# Patient Record
Sex: Female | Born: 1990
Health system: Southern US, Community
[De-identification: ages and names within clinical notes are randomized; demographics above are authoritative.]

## PROBLEM LIST (undated history)

## (undated) DIAGNOSIS — I10 Essential (primary) hypertension: Secondary | ICD-10-CM

## (undated) DIAGNOSIS — Z789 Other specified health status: Secondary | ICD-10-CM

## (undated) DIAGNOSIS — O139 Gestational [pregnancy-induced] hypertension without significant proteinuria, unspecified trimester: Secondary | ICD-10-CM

## (undated) HISTORY — PX: DILATION AND CURETTAGE OF UTERUS: SHX78

## (undated) HISTORY — DX: Other specified health status: Z78.9

---

## 2018-11-21 ENCOUNTER — Encounter: Payer: Self-pay | Admitting: Certified Nurse Midwife

## 2018-11-22 ENCOUNTER — Other Ambulatory Visit (HOSPITAL_COMMUNITY)
Admission: RE | Admit: 2018-11-22 | Discharge: 2018-11-22 | Disposition: A | Discharge status: Home / Self Care | Payer: Medicaid Other | Source: Ambulatory Visit | Attending: Certified Nurse Midwife | Admitting: Certified Nurse Midwife

## 2018-11-22 ENCOUNTER — Ambulatory Visit (INDEPENDENT_AMBULATORY_CARE_PROVIDER_SITE_OTHER): Payer: Medicaid Other | Admitting: Certified Nurse Midwife

## 2018-11-22 ENCOUNTER — Encounter: Payer: Self-pay | Admitting: Certified Nurse Midwife

## 2018-11-22 VITALS — BP 115/77 | HR 77 | Ht 61.0 in | Wt 135.0 lb

## 2018-11-22 DIAGNOSIS — Z283 Underimmunization status: Secondary | ICD-10-CM

## 2018-11-22 DIAGNOSIS — Z3480 Encounter for supervision of other normal pregnancy, unspecified trimester: Secondary | ICD-10-CM | POA: Diagnosis not present

## 2018-11-22 DIAGNOSIS — Z3481 Encounter for supervision of other normal pregnancy, first trimester: Secondary | ICD-10-CM

## 2018-11-22 DIAGNOSIS — O99891 Other specified diseases and conditions complicating pregnancy: Secondary | ICD-10-CM

## 2018-11-22 DIAGNOSIS — Z2839 Other underimmunization status: Secondary | ICD-10-CM

## 2018-11-22 DIAGNOSIS — O219 Vomiting of pregnancy, unspecified: Secondary | ICD-10-CM

## 2018-11-22 DIAGNOSIS — O9989 Other specified diseases and conditions complicating pregnancy, childbirth and the puerperium: Secondary | ICD-10-CM

## 2018-11-22 MED ORDER — PREPLUS 27-1 MG PO TABS: 1.0000 | ORAL_TABLET | Freq: Every day | ORAL | 13 refills | Status: AC

## 2018-11-22 MED ORDER — ONDANSETRON 4 MG PO TBDP: 4.0000 mg | ORAL_TABLET | Freq: Three times a day (TID) | ORAL | 2 refills | Status: DC | PRN

## 2018-11-22 NOTE — Patient Instructions (Signed)
Safe Medications in Pregnancy   Acne:  Benzoyl Peroxide  Salicylic Acid   Backache/Headache:  Tylenol: 2 regular strength every 4 hours OR        2 Extra strength every 6 hours   Colds/Coughs/Allergies:  Benadryl (alcohol free) 25 mg every 6 hours as needed  Breath right strips  Claritin  Cepacol throat lozenges  Chloraseptic throat spray  Cold-Eeze- up to three times per day  Cough drops, alcohol free  Flonase (by prescription only)  Guaifenesin  Mucinex  Robitussin DM (plain only, alcohol free)  Saline nasal spray/drops  Sudafed (pseudoephedrine) & Actifed * use only after [redacted] weeks gestation and if you do not have high blood pressure  Tylenol  Vicks Vaporub  Zinc lozenges  Zyrtec   Constipation:  Colace  Ducolax suppositories  Fleet enema  Glycerin suppositories  Metamucil  Milk of magnesia  Miralax  Senokot  Smooth move tea   Diarrhea:  Kaopectate  Imodium A-D   *NO pepto Bismol   Hemorrhoids:  Anusol  Anusol HC  Preparation H  Tucks   Indigestion:  Tums  Maalox  Mylanta  Zantac  Pepcid   Insomnia:  Benadryl (alcohol free) 25mg every 6 hours as needed  Tylenol PM  Unisom, no Gelcaps   Leg Cramps:  Tums  MagGel   Nausea/Vomiting:  Bonine  Dramamine  Emetrol  Ginger extract  Sea bands  Meclizine  Nausea medication to take during pregnancy:  Unisom (doxylamine succinate 25 mg tablets) Take one tablet daily at bedtime. If symptoms are not adequately controlled, the dose can be increased to a maximum recommended dose of two tablets daily (1/2 tablet in the morning, 1/2 tablet mid-afternoon and one at bedtime).  Vitamin B6 100mg tablets. Take one tablet twice a day (up to 200 mg per day).   Skin Rashes:  Aveeno products  Benadryl cream or 25mg every 6 hours as needed  Calamine Lotion  1% cortisone cream   Yeast infection:  Gyne-lotrimin 7  Monistat 7    **If taking multiple medications, please check labels to avoid  duplicating the same active ingredients  **take medication as directed on the label  ** Do not exceed 4000 mg of tylenol in 24 hours  **Do not take medications that contain aspirin or ibuprofen         First Trimester of Pregnancy The first trimester of pregnancy is from week 1 until the end of week 13 (months 1 through 3). During this time, your baby will begin to develop inside you. At 6-8 weeks, the eyes and face are formed, and the heartbeat can be seen on ultrasound. At the end of 12 weeks, all the baby's organs are formed. Prenatal care is all the medical care you receive before the birth of your baby. Make sure you get good prenatal care and follow all of your doctor's instructions. Follow these instructions at home: Medicines  Take over-the-counter and prescription medicines only as told by your doctor. Some medicines are safe and some medicines are not safe during pregnancy.  Take a prenatal vitamin that contains at least 600 micrograms (mcg) of folic acid.  If you have trouble pooping (constipation), take medicine that will make your stool soft (stool softener) if your doctor approves. Eating and drinking  Eat regular, healthy meals.  Your doctor will tell you the amount of weight gain that is right for you.  Avoid raw meat and uncooked cheese.  If you feel sick to your stomach (nauseous) or   throw up (vomit): ? Eat 4 or 5 small meals a day instead of 3 large meals. ? Try eating a few soda crackers. ? Drink liquids between meals instead of during meals.  To prevent constipation: ? Eat foods that are high in fiber, like fresh fruits and vegetables, whole grains, and beans. ? Drink enough fluids to keep your pee (urine) clear or pale yellow. Activity  Exercise only as told by your doctor. Stop exercising if you have cramps or pain in your lower belly (abdomen) or low back.  Do not exercise if it is too hot, too humid, or if you are in a place of great height (high  altitude).  Try to avoid standing for long periods of time. Move your legs often if you must stand in one place for a long time.  Avoid heavy lifting.  Wear low-heeled shoes. Sit and stand up straight.  You can have sex unless your doctor tells you not to. Relieving pain and discomfort  Wear a good support bra if your breasts are sore.  Take warm water baths (sitz baths) to soothe pain or discomfort caused by hemorrhoids. Use hemorrhoid cream if your doctor says it is okay.  Rest with your legs raised if you have leg cramps or low back pain.  If you have puffy, bulging veins (varicose veins) in your legs: ? Wear support hose or compression stockings as told by your doctor. ? Raise (elevate) your feet for 15 minutes, 3-4 times a day. ? Limit salt in your food. Prenatal care  Schedule your prenatal visits by the twelfth week of pregnancy.  Write down your questions. Take them to your prenatal visits.  Keep all your prenatal visits as told by your doctor. This is important. Safety  Wear your seat belt at all times when driving.  Make a list of emergency phone numbers. The list should include numbers for family, friends, the hospital, and police and fire departments. General instructions  Ask your doctor for a referral to a local prenatal class. Begin classes no later than at the start of month 6 of your pregnancy.  Ask for help if you need counseling or if you need help with nutrition. Your doctor can give you advice or tell you where to go for help.  Do not use hot tubs, steam rooms, or saunas.  Do not douche or use tampons or scented sanitary pads.  Do not cross your legs for long periods of time.  Avoid all herbs and alcohol. Avoid drugs that are not approved by your doctor.  Do not use any tobacco products, including cigarettes, chewing tobacco, and electronic cigarettes. If you need help quitting, ask your doctor. You may get counseling or other support to help you  quit.  Avoid cat litter boxes and soil used by cats. These carry germs that can cause birth defects in the baby and can cause a loss of your baby (miscarriage) or stillbirth.  Visit your dentist. At home, brush your teeth with a soft toothbrush. Be gentle when you floss. Contact a doctor if:  You are dizzy.  You have mild cramps or pressure in your lower belly.  You have a nagging pain in your belly area.  You continue to feel sick to your stomach, you throw up, or you have watery poop (diarrhea).  You have a bad smelling fluid coming from your vagina.  You have pain when you pee (urinate).  You have increased puffiness (swelling) in your face, hands, legs,   or ankles. Get help right away if:  You have a fever.  You are leaking fluid from your vagina.  You have spotting or bleeding from your vagina.  You have very bad belly cramping or pain.  You gain or lose weight rapidly.  You throw up blood. It may look like coffee grounds.  You are around people who have German measles, fifth disease, or chickenpox.  You have a very bad headache.  You have shortness of breath.  You have any kind of trauma, such as from a fall or a car accident. Summary  The first trimester of pregnancy is from week 1 until the end of week 13 (months 1 through 3).  To take care of yourself and your unborn baby, you will need to eat healthy meals, take medicines only if your doctor tells you to do so, and do activities that are safe for you and your baby.  Keep all follow-up visits as told by your doctor. This is important as your doctor will have to ensure that your baby is healthy and growing well. This information is not intended to replace advice given to you by your health care provider. Make sure you discuss any questions you have with your health care provider. Document Released: 05/23/2008 Document Revised: 12/13/2016 Document Reviewed: 12/13/2016 Elsevier Interactive Patient Education   2017 Elsevier Inc.  

## 2018-11-23 DIAGNOSIS — O99891 Other specified diseases and conditions complicating pregnancy: Secondary | ICD-10-CM | POA: Insufficient documentation

## 2018-11-23 DIAGNOSIS — O9989 Other specified diseases and conditions complicating pregnancy, childbirth and the puerperium: Secondary | ICD-10-CM

## 2018-11-23 DIAGNOSIS — Z283 Underimmunization status: Secondary | ICD-10-CM | POA: Insufficient documentation

## 2018-11-23 DIAGNOSIS — O09899 Supervision of other high risk pregnancies, unspecified trimester: Secondary | ICD-10-CM | POA: Insufficient documentation

## 2018-11-23 DIAGNOSIS — O219 Vomiting of pregnancy, unspecified: Secondary | ICD-10-CM | POA: Insufficient documentation

## 2018-11-23 LAB — OBSTETRIC PANEL, INCLUDING HIV
Antibody Screen: NEGATIVE
Basophils Absolute: 0 10*3/uL (ref 0.0–0.2)
Basos: 0 %
EOS (ABSOLUTE): 0.1 10*3/uL (ref 0.0–0.4)
Eos: 1 %
HIV Screen 4th Generation wRfx: NONREACTIVE
Hematocrit: 31.2 % — ABNORMAL LOW (ref 34.0–46.6)
Hemoglobin: 10 g/dL — ABNORMAL LOW (ref 11.1–15.9)
Hepatitis B Surface Ag: NEGATIVE
Immature Grans (Abs): 0 10*3/uL (ref 0.0–0.1)
Immature Granulocytes: 0 %
Lymphocytes Absolute: 1.9 10*3/uL (ref 0.7–3.1)
Lymphs: 18 %
MCH: 23.9 pg — ABNORMAL LOW (ref 26.6–33.0)
MCHC: 32.1 g/dL (ref 31.5–35.7)
MCV: 75 fL — ABNORMAL LOW (ref 79–97)
Monocytes Absolute: 0.9 10*3/uL (ref 0.1–0.9)
Monocytes: 8 %
Neutrophils Absolute: 7.5 10*3/uL — ABNORMAL HIGH (ref 1.4–7.0)
Neutrophils: 73 %
Platelets: 390 10*3/uL (ref 150–450)
RBC: 4.18 x10E6/uL (ref 3.77–5.28)
RDW: 14 % (ref 12.3–15.4)
RPR Ser Ql: NONREACTIVE
Rh Factor: POSITIVE
Rubella Antibodies, IGG: <0.9 index — ABNORMAL LOW (ref >0.99)
WBC: 10.4 10*3/uL (ref 3.4–10.8)

## 2018-11-23 LAB — CYTOLOGY - PAP
Chlamydia: NEGATIVE
Diagnosis: NEGATIVE
Neisseria Gonorrhea: NEGATIVE
Trichomonas: NEGATIVE

## 2018-11-23 NOTE — Progress Notes (Signed)
Subjective:   Taylor Cole is a 27 y.o. G2P1001 at [redacted]w[redacted]d by LMP being seen today for her first obstetrical visit.  Her obstetrical history is significant for nothing. Patient does intend to breast feed. Pregnancy history fully reviewed.  Patient reports nausea and vomiting.  HISTORY: OB History  Gravida Para Term Preterm AB Living  2 1 1  0 0 1  SAB TAB Ectopic Multiple Live Births  0 0 0 0 1    # Outcome Date GA Lbr Len/2nd Weight Sex Delivery Anes PTL Lv  2 Current           1 Term 09/16/16    F Vag-Spont   LIV    She reports that she has never had a pap smear   History reviewed. No pertinent past medical history. History reviewed. No pertinent surgical history. History reviewed. No pertinent family history. Social History   Tobacco Use  . Smoking status: Never Smoker  . Smokeless tobacco: Never Used  Substance Use Topics  . Alcohol use: Never    Frequency: Never  . Drug use: Never   Not on File Current Outpatient Medications on File Prior to Visit  Medication Sig Dispense Refill  . folic acid (FOLVITE) 1 MG tablet Take 1 mg by mouth daily.     No current facility-administered medications on file prior to visit.     Review of Systems Pertinent items noted in HPI and remainder of comprehensive ROS otherwise negative.  Exam   Vitals:   11/22/18 1606 11/22/18 1607  BP: 115/77   Pulse: 77   Weight: 135 lb (61.2 kg)   Height:  5\' 1"  (1.549 m)   Fetal Heart Rate (bpm): 160  Pelvic Exam: Perineum: no hemorrhoids, normal perineum   Vulva: normal external genitalia, no lesions   Vagina:  normal mucosa, normal discharge   Cervix: no lesions and normal, pap smear done.    Adnexa: normal adnexa and no mass, fullness, tenderness   Bony Pelvis: average  System: General: well-developed, well-nourished female in no acute distress   Breasts:  normal appearance, no masses or tenderness bilaterally   Skin: normal coloration and turgor, no rashes   Neurologic:  oriented, normal, negative, normal mood   Extremities: normal strength, tone, and muscle mass, ROM of all joints is normal   HEENT PERRLA, extraocular movement intact and sclera clear   Mouth/Teeth mucous membranes moist, pharynx normal without lesions and dental hygiene good   Neck supple and no masses   Cardiovascular: regular rate and rhythm   Respiratory:  no respiratory distress, normal breath sounds   Abdomen: soft, non-tender; bowel sounds normal; no masses,  no organomegaly   Assessment:   Pregnancy: G2P1001 Patient Active Problem List   Diagnosis Date Noted  . Rubella non-immune status, antepartum 11/23/2018  . Nausea and vomiting during pregnancy 11/23/2018  . Supervision of other normal pregnancy, antepartum 11/22/2018     Plan:  1. Supervision of other normal pregnancy, antepartum - Patient doing well, reports nausea and vomiting during pregnancy  - Obstetric Panel, Including HIV - Hemoglobinopathy evaluation - Culture, OB Urine - Cystic Fibrosis Mutation 97 - SMN1 Copy Number Analysis - Genetic Screening - Cytology - PAP( [place]) - Prenatal Vit-Fe Fumarate-FA (PREPLUS) 27-1 MG TABS; Take 1 tablet by mouth daily.  Dispense: 30 tablet; Refill: 13  2. Nausea and vomiting during pregnancy - educated and discussed eating habits during pregnancy  - ondansetron (ZOFRAN ODT) 4 MG disintegrating tablet; Take 1  tablet (4 mg total) by mouth every 8 (eight) hours as needed for nausea or vomiting.  Dispense: 30 tablet; Refill: 2   Initial labs drawn. Rx for prenatal vitamins. Genetic Screening discussed, NIPS: ordered. Ultrasound discussed; fetal anatomic survey: requested. Problem list reviewed and updated. The nature of [facility] with multiple MDs and other Advanced Practice Providers was explained to patient; also emphasized that residents, students are part of our team. Routine obstetric precautions reviewed. Return in about 4  weeks (around 12/20/2018) for ROB.     Sharyon CableVeronica C Lodema Parma, CNM Center for [facility], [facility]

## 2018-11-25 LAB — URINE CULTURE, OB REFLEX

## 2018-11-25 LAB — CULTURE, OB URINE

## 2018-11-27 LAB — HEMOGLOBINOPATHY EVALUATION
HGB C: 0 %
HGB S: 0 %
HGB VARIANT: 0 %
Hemoglobin A2 Quantitation: 2 % (ref 1.8–3.2)
Hemoglobin F Quantitation: 0 % (ref 0.0–2.0)
Hgb A: 98 % (ref 96.4–98.8)

## 2018-12-04 LAB — CYSTIC FIBROSIS MUTATION 97: Interpretation: NOT DETECTED

## 2018-12-04 LAB — SMN1 COPY NUMBER ANALYSIS (SMA CARRIER SCREENING)

## 2018-12-17 ENCOUNTER — Telehealth: Payer: Self-pay

## 2018-12-17 NOTE — Telephone Encounter (Signed)
Returned call, no answer, left vm 

## 2018-12-20 ENCOUNTER — Ambulatory Visit (INDEPENDENT_AMBULATORY_CARE_PROVIDER_SITE_OTHER): Payer: Medicaid Other | Admitting: Medical

## 2018-12-20 ENCOUNTER — Encounter: Payer: Self-pay | Admitting: Certified Nurse Midwife

## 2018-12-20 ENCOUNTER — Encounter: Payer: Self-pay | Admitting: Medical

## 2018-12-20 DIAGNOSIS — Z3480 Encounter for supervision of other normal pregnancy, unspecified trimester: Secondary | ICD-10-CM

## 2018-12-20 DIAGNOSIS — O285 Abnormal chromosomal and genetic finding on antenatal screening of mother: Secondary | ICD-10-CM

## 2018-12-20 DIAGNOSIS — Z148 Genetic carrier of other disease: Secondary | ICD-10-CM | POA: Insufficient documentation

## 2018-12-20 DIAGNOSIS — Z3482 Encounter for supervision of other normal pregnancy, second trimester: Secondary | ICD-10-CM

## 2018-12-20 NOTE — Progress Notes (Signed)
Pt presents for ROB requests to discuss genetic test results.

## 2018-12-20 NOTE — Patient Instructions (Signed)

## 2018-12-20 NOTE — Progress Notes (Signed)
   PRENATAL VISIT NOTE  Subjective:  Taylor Cole is a 28 y.o. G2P1001 at [redacted]w[redacted]d being seen today for ongoing prenatal care.  She is currently monitored for the following issues for this high-risk pregnancy and has Supervision of other normal pregnancy, antepartum; Rubella non-immune status, antepartum; Nausea and vomiting during pregnancy; and Abnormal genetic test during pregnancy on their problem list.  Patient reports no complaints.  Contractions: Not present. Vag. Bleeding: None.  Movement: Absent. Denies leaking of fluid.   The following portions of the patient's history were reviewed and updated as appropriate: allergies, current medications, past family history, past medical history, past social history, past surgical history and problem list. Problem list updated.  Objective:   Vitals:   12/20/18 1500  BP: 129/81  Pulse: 87  Weight: 138 lb 12.8 oz (63 kg)    Fetal Status: Fetal Heart Rate (bpm): 168   Movement: Absent     General:  Alert, oriented and cooperative. Patient is in no acute distress.  Skin: Skin is warm and dry. No rash noted.   Cardiovascular: Normal heart rate noted  Respiratory: Normal respiratory effort, no problems with respiration noted  Abdomen: Soft, gravid, appropriate for gestational age.  Pain/Pressure: Absent     Pelvic: Cervical exam deferred        Extremities: Normal range of motion.  Edema: None  Mental Status: Normal mood and affect. Normal behavior. Normal judgment and thought content.   Assessment and Plan:  Pregnancy: G2P1001 at [redacted]w[redacted]d  1. Supervision of other normal pregnancy, antepartum - AFP, Serum, Open Spina Bifida - [place] MFM OB COMP + 14 WK; Future  2. Abnormal genetic test during pregnancy - AMB referral to maternal fetal medicine  Preterm labor/second trimester warning symptoms and general obstetric precautions including but not limited to vaginal bleeding, contractions, leaking of fluid and fetal movement were reviewed in detail  with the patient. Please refer to After Visit Summary for other counseling recommendations.  Return in about 4 weeks (around 01/17/2019) for [facility].  Future Appointments  Date Time Provider Department Center  01/17/2019  3:00 PM Hermina Staggers, MD [facility] None    Vonzella Nipple, PA-C

## 2018-12-22 LAB — AFP, SERUM, OPEN SPINA BIFIDA
AFP MoM: 0.67
AFP Value: 23 ng/mL
Gest. Age on Collection Date: 15.1 weeks
MATERNAL AGE AT EDD: 29.1 a
OSBR Risk 1 IN: 10000
Test Results:: NEGATIVE
Weight: 138 [lb_av]

## 2018-12-25 ENCOUNTER — Other Ambulatory Visit: Payer: Self-pay | Admitting: *Deleted

## 2018-12-25 DIAGNOSIS — Z3480 Encounter for supervision of other normal pregnancy, unspecified trimester: Secondary | ICD-10-CM

## 2018-12-25 NOTE — Progress Notes (Signed)
Change needed in u/s order per A.Garner Nash.

## 2019-01-04 ENCOUNTER — Other Ambulatory Visit (HOSPITAL_COMMUNITY): Payer: Self-pay

## 2019-01-04 ENCOUNTER — Ambulatory Visit (HOSPITAL_COMMUNITY): Payer: Self-pay | Admitting: Medical

## 2019-01-04 ENCOUNTER — Ambulatory Visit (HOSPITAL_BASED_OUTPATIENT_CLINIC_OR_DEPARTMENT_OTHER)
Admission: RE | Admit: 2019-01-04 | Discharge: 2019-01-04 | Disposition: A | Discharge status: Home / Self Care | Payer: Medicaid Other | Source: Ambulatory Visit | Attending: Medical | Admitting: Medical

## 2019-01-04 ENCOUNTER — Encounter (HOSPITAL_COMMUNITY): Payer: Self-pay

## 2019-01-04 ENCOUNTER — Ambulatory Visit (HOSPITAL_COMMUNITY)
Admission: RE | Admit: 2019-01-04 | Discharge: 2019-01-04 | Disposition: A | Discharge status: Home / Self Care | Payer: Medicaid Other | Source: Ambulatory Visit | Attending: Medical | Admitting: Medical

## 2019-01-04 ENCOUNTER — Other Ambulatory Visit (HOSPITAL_COMMUNITY): Payer: Self-pay | Admitting: Medical

## 2019-01-04 VITALS — BP 133/77 | HR 89 | Wt 141.0 lb

## 2019-01-04 DIAGNOSIS — Z148 Genetic carrier of other disease: Secondary | ICD-10-CM | POA: Insufficient documentation

## 2019-01-04 DIAGNOSIS — Z363 Encounter for antenatal screening for malformations: Secondary | ICD-10-CM | POA: Diagnosis not present

## 2019-01-04 DIAGNOSIS — O289 Unspecified abnormal findings on antenatal screening of mother: Secondary | ICD-10-CM | POA: Insufficient documentation

## 2019-01-04 DIAGNOSIS — Z3A17 17 weeks gestation of pregnancy: Secondary | ICD-10-CM | POA: Diagnosis not present

## 2019-01-04 DIAGNOSIS — O9989 Other specified diseases and conditions complicating pregnancy, childbirth and the puerperium: Secondary | ICD-10-CM

## 2019-01-04 DIAGNOSIS — Z3480 Encounter for supervision of other normal pregnancy, unspecified trimester: Secondary | ICD-10-CM

## 2019-01-04 DIAGNOSIS — Z283 Underimmunization status: Secondary | ICD-10-CM

## 2019-01-04 DIAGNOSIS — O285 Abnormal chromosomal and genetic finding on antenatal screening of mother: Secondary | ICD-10-CM

## 2019-01-04 DIAGNOSIS — O219 Vomiting of pregnancy, unspecified: Secondary | ICD-10-CM

## 2019-01-04 DIAGNOSIS — O09899 Supervision of other high risk pregnancies, unspecified trimester: Secondary | ICD-10-CM

## 2019-01-07 ENCOUNTER — Other Ambulatory Visit (HOSPITAL_COMMUNITY): Payer: Self-pay | Admitting: Medical

## 2019-01-07 ENCOUNTER — Telehealth (HOSPITAL_COMMUNITY): Payer: Self-pay | Admitting: Obstetrics and Gynecology

## 2019-01-07 NOTE — Telephone Encounter (Signed)
Called patient and informed her of abnormal FISH results. Female TRISOMY 21. Final culture results are pending.

## 2019-01-08 ENCOUNTER — Institutional Professional Consult (permissible substitution): Payer: Medicaid Other | Admitting: Obstetrics & Gynecology

## 2019-01-08 ENCOUNTER — Telehealth: Payer: Self-pay

## 2019-01-08 NOTE — Telephone Encounter (Signed)
Returned call, pt wanted to see if she can get earlier appt to discuss results, transferred to scheduler.

## 2019-01-09 ENCOUNTER — Ambulatory Visit (INDEPENDENT_AMBULATORY_CARE_PROVIDER_SITE_OTHER): Payer: Medicaid Other | Admitting: Obstetrics & Gynecology

## 2019-01-09 VITALS — BP 118/78 | HR 86 | Wt 142.5 lb

## 2019-01-09 DIAGNOSIS — O285 Abnormal chromosomal and genetic finding on antenatal screening of mother: Secondary | ICD-10-CM | POA: Diagnosis not present

## 2019-01-09 DIAGNOSIS — Z3482 Encounter for supervision of other normal pregnancy, second trimester: Secondary | ICD-10-CM

## 2019-01-09 NOTE — Progress Notes (Signed)
OB presents for Consult regarding [place]

## 2019-01-09 NOTE — Progress Notes (Signed)
   PRENATAL VISIT NOTE  Subjective:  Taylor Cole is a 28 y.o. G2P1001 at [redacted]w[redacted]d being seen today for ongoing prenatal care.  She is currently monitored for the following issues for this high-risk pregnancy and has Supervision of other normal pregnancy, antepartum; Rubella non-immune status, antepartum; Nausea and vomiting during pregnancy; and Abnormal genetic test during pregnancy on their problem list.  Patient reports recent amnio Fish result Tri 21, requests termination.  Contractions: Not present. Vag. Bleeding: None.   . Denies leaking of fluid.   The following portions of the patient's history were reviewed and updated as appropriate: allergies, current medications, past family history, past medical history, past social history, past surgical history and problem list. Problem list updated.  Objective:   Vitals:   01/09/19 1441  BP: 118/78  Pulse: 86  Weight: 142 lb 8 oz (64.6 kg)    Fetal Status:           General:  Alert, oriented and cooperative. Patient is in no acute distress.  Skin: Skin is warm and dry. No rash noted.   Cardiovascular: Normal heart rate noted  Respiratory: Normal respiratory effort, no problems with respiration noted  Abdomen: Soft, gravid, appropriate for gestational age.  Pain/Pressure: Absent     Pelvic: Cervical exam deferred        Extremities: Normal range of motion.  Edema: None  Mental Status: Normal mood and affect. Normal behavior. Normal judgment and thought content.   Assessment and Plan:  Pregnancy: G2P1001 at [redacted]w[redacted]d  There are no diagnoses linked to this encounter. Preterm labor symptoms and general obstetric precautions including but not limited to vaginal bleeding, contractions, leaking of fluid and fetal movement were reviewed in detail with the patient. Please refer to After Visit Summary for other counseling recommendations.  Return if symptoms worsen or fail to improve.  Future Appointments  Date Time Provider Department Center    01/21/2019  3:00 PM Constant, Gigi Gin, MD [facility] None  01/24/2019 11:15 AM [facility] [place] 4 [facility]   Referred to PP for consultation re: termination prior to 20 weeks Scheryl Darter, MD

## 2019-01-14 ENCOUNTER — Other Ambulatory Visit (HOSPITAL_COMMUNITY): Payer: Self-pay | Admitting: Medical

## 2019-01-17 ENCOUNTER — Telehealth: Payer: Self-pay

## 2019-01-17 ENCOUNTER — Encounter: Payer: Medicaid Other | Admitting: Obstetrics and Gynecology

## 2019-01-17 NOTE — Telephone Encounter (Signed)
Returned call and pt stated that she is nol longer pregnant and would like to cancel all appointments Informed scheduler.

## 2019-01-18 ENCOUNTER — Ambulatory Visit (HOSPITAL_COMMUNITY): Payer: Medicaid Other

## 2019-01-21 ENCOUNTER — Encounter: Payer: Medicaid Other | Admitting: Obstetrics and Gynecology

## 2019-01-24 ENCOUNTER — Ambulatory Visit (HOSPITAL_COMMUNITY): Payer: Medicaid Other

## 2019-01-25 ENCOUNTER — Ambulatory Visit (HOSPITAL_COMMUNITY): Payer: Medicaid Other

## 2019-01-25 ENCOUNTER — Other Ambulatory Visit (HOSPITAL_COMMUNITY): Payer: Self-pay | Admitting: Medical

## 2019-01-31 ENCOUNTER — Telehealth (HOSPITAL_COMMUNITY): Payer: Self-pay | Admitting: Obstetrics and Gynecology

## 2019-01-31 NOTE — Telephone Encounter (Signed)
I called the patient to confirm that she [facility] about final [facility] (culture) results. Final results confirmed trisomy 21. Patient had TOP and feels well.

## 2019-05-07 ENCOUNTER — Ambulatory Visit (INDEPENDENT_AMBULATORY_CARE_PROVIDER_SITE_OTHER): Payer: Medicaid Other

## 2019-05-07 ENCOUNTER — Other Ambulatory Visit: Payer: Self-pay

## 2019-05-07 DIAGNOSIS — Z3201 Encounter for pregnancy test, result positive: Secondary | ICD-10-CM

## 2019-05-07 DIAGNOSIS — Z3200 Encounter for pregnancy test, result unknown: Secondary | ICD-10-CM

## 2019-05-07 LAB — POCT URINE PREGNANCY: Preg Test, Ur: POSITIVE — AB

## 2019-05-07 NOTE — Progress Notes (Signed)
Agree with A & P. 

## 2019-05-07 NOTE — Progress Notes (Signed)
..   Ms. Paulino presents today for UPT. She has no unusual complaints. LMP: 04-07-19    OBJECTIVE: Appears well, in no apparent distress.  OB History    Gravida  3   Para  1   Term  1   Preterm      AB  1   Living  1     SAB      TAB  1   Ectopic      Multiple      Live Births  1          Home UPT Result:Positive  In-Office UPT result:Positive I have reviewed the patient's medical, obstetrical, social, and family histories, and medications.   ASSESSMENT: Positive pregnancy test  PLAN Prenatal care to be completed at: [facility]

## 2019-06-24 ENCOUNTER — Encounter: Payer: Self-pay | Admitting: Obstetrics & Gynecology

## 2019-06-24 ENCOUNTER — Other Ambulatory Visit: Payer: Self-pay

## 2019-06-24 ENCOUNTER — Ambulatory Visit (INDEPENDENT_AMBULATORY_CARE_PROVIDER_SITE_OTHER): Payer: Medicaid Other | Admitting: Obstetrics & Gynecology

## 2019-06-24 ENCOUNTER — Other Ambulatory Visit (HOSPITAL_COMMUNITY)
Admission: RE | Admit: 2019-06-24 | Discharge: 2019-06-24 | Disposition: A | Discharge status: Home / Self Care | Payer: Medicaid Other | Source: Ambulatory Visit | Attending: Obstetrics & Gynecology | Admitting: Obstetrics & Gynecology

## 2019-06-24 VITALS — BP 124/78 | HR 90 | Wt 145.5 lb

## 2019-06-24 DIAGNOSIS — Z148 Genetic carrier of other disease: Secondary | ICD-10-CM | POA: Diagnosis not present

## 2019-06-24 DIAGNOSIS — Z3A11 11 weeks gestation of pregnancy: Secondary | ICD-10-CM

## 2019-06-24 DIAGNOSIS — Z3480 Encounter for supervision of other normal pregnancy, unspecified trimester: Secondary | ICD-10-CM

## 2019-06-24 DIAGNOSIS — O219 Vomiting of pregnancy, unspecified: Secondary | ICD-10-CM

## 2019-06-24 DIAGNOSIS — O09299 Supervision of pregnancy with other poor reproductive or obstetric history, unspecified trimester: Secondary | ICD-10-CM | POA: Diagnosis not present

## 2019-06-24 DIAGNOSIS — O09899 Supervision of other high risk pregnancies, unspecified trimester: Secondary | ICD-10-CM

## 2019-06-24 DIAGNOSIS — O9989 Other specified diseases and conditions complicating pregnancy, childbirth and the puerperium: Secondary | ICD-10-CM

## 2019-06-24 DIAGNOSIS — O09291 Supervision of pregnancy with other poor reproductive or obstetric history, first trimester: Secondary | ICD-10-CM

## 2019-06-24 DIAGNOSIS — Z283 Underimmunization status: Secondary | ICD-10-CM

## 2019-06-24 MED ORDER — BLOOD PRESSURE KIT: 1.0000 | PACK | 0 refills | Status: AC

## 2019-06-24 MED ORDER — DOXYLAMINE-PYRIDOXINE 10-10 MG PO TBEC: 2.0000 | DELAYED_RELEASE_TABLET | Freq: Every day | ORAL | 5 refills | Status: DC

## 2019-06-24 MED ORDER — FAMOTIDINE 20 MG PO TABS: 20.0000 mg | ORAL_TABLET | Freq: Two times a day (BID) | ORAL | 3 refills | Status: DC

## 2019-06-24 NOTE — Progress Notes (Addendum)
History:   Taylor Cole is a 27 y.o. G3P1011 at 76w1dby LMP being seen today for her first obstetrical visit.  Her obstetrical history is significant for recent previous pregnancy with trisomy 21 fetus, ended in termination of pregnancy. Also has history of term SVD. Patient does intend to breast feed. Pregnancy history fully reviewed.  Patient reports heartburn, nausea and vomiting. Desires medication to help.      HISTORY: OB History  Gravida Para Term Preterm AB Living  _0 0 1 1  SAB TAB Ectopic Multiple Live Births  0 1 0 0 1    # Outcome Date GA Lbr Len/2nd Weight Sex Delivery Anes PTL Lv  3 Current           2 TAB 01/2019          1 Term 09/16/16    F Vag-Spont   LIV    Last pap smear was done 11/2018 and was normal  Past Medical History:  Diagnosis Date  . Medical history non-contributory    Past Surgical History:  Procedure Laterality Date  . DILATION AND CURETTAGE OF UTERUS     Family History  Problem Relation Age of Onset  . Arthritis Mother   . Hypertension Father   . Stroke Father    Social History   Tobacco Use  . Smoking status: Never Smoker  . Smokeless tobacco: Never Used  Substance Use Topics  . Alcohol use: Never    Frequency: Never  . Drug use: Never   No Known Allergies Current Outpatient Medications on File Prior to Visit  Medication Sig Dispense Refill  . folic acid (FOLVITE) 1 MG tablet Take 1 mg by mouth daily.    . ondansetron (ZOFRAN ODT) 4 MG disintegrating tablet Take 1 tablet (4 mg total) by mouth every 8 (eight) hours as needed for nausea or vomiting. (Patient not taking: Reported on 01/04/2019) 30 tablet 2  . Prenatal Vit-Fe Fumarate-FA (PREPLUS) 27-1 MG TABS Take 1 tablet by mouth daily. 30 tablet 13   No current facility-administered medications on file prior to visit.     Review of Systems Pertinent items noted in HPI and remainder of comprehensive ROS otherwise negative. Physical Exam:   Vitals:   06/24/19 1502   BP: 124/78  Pulse: 90  Weight: 145 lb 8 oz (66 kg)   Fetal Heart Rate (bpm): 164 Uterus:     Pelvic Exam: Perineum: no hemorrhoids, normal perineum   Vulva: normal external genitalia, no lesions   Vagina:  normal mucosa, normal discharge   Cervix: no lesions and normal, pap smear done.    Adnexa: normal adnexa and no mass, fullness, tenderness   Bony Pelvis: average  System: General: well-developed, well-nourished female in no acute distress   Breasts:  normal appearance, no masses or tenderness bilaterally   Skin: normal coloration and turgor, no rashes   Neurologic: oriented, normal, negative, normal mood   Extremities: normal strength, tone, and muscle mass, ROM of all joints is normal   HEENT PERRLA, extraocular movement intact and sclera clear, anicteric   Mouth/Teeth mucous membranes moist, pharynx normal without lesions and dental hygiene good   Neck supple and no masses   Cardiovascular: regular rate and rhythm   Respiratory:  no respiratory distress, normal breath sounds   Abdomen: soft, non-tender; bowel sounds normal; no masses,  no organomegaly     Assessment:    Pregnancy: [id number] Active Problem List   Diagnosis Date Noted  .  Supervision of other normal pregnancy, antepartum 06/24/2019  . Trisomy 21 fetus in previous pregnancy, currently pregnant 06/24/2019  . SMA carrier status 12/20/2018     Plan:    1. Nausea and vomiting in pregnancy - Doxylamine-Pyridoxine (DICLEGIS) 10-10 MG TBEC; Take 2 tablets by mouth at bedtime. If symptoms persist, add one tablet in the morning and one in the afternoon  Dispense: 100 tablet; Refill: 5 - famotidine (PEPCID) 20 MG tablet; Take 1 tablet (20 mg total) by mouth 2 (two) times daily.  Dispense: 60 tablet; Refill: 3  2. Trisomy 21 fetus in previous pregnancy, currently pregnant Recommended repeat NIPS today, also NT scan. Will follow up results and manage accordingly. - Genetic Screening - US MFM Fetal Nuchal  Translucency; Future  3. SMA carrier status Has only 1 copy; already had genetic counseling done in previous pregnancy. FOB was normal. No concern for this pregnancy.  4. Supervision of other normal pregnancy, antepartum - Obstetric Panel, Including HIV - Culture, OB Urine - [facility] AMB BABYSCRIPTS SCHEDULE OPTIMIZATION - [facility] AMB BABYSCRIPTS OPT IN - Cervicovaginal ancillary only( [place]) - Blood Pressure KIT; 1 kit by Does not apply route once a week. Check BP Weekly.  Large Cuff DX: Z13.6   Z34.86  Dispense: 1 kit; Refill: 0 - [place] MFM OB COMP + 14 WK; Future - Comprehensive metabolic panel - Hemoglobin A1c - TSH Initial labs drawn. Continue prenatal vitamins. Genetic Screening discussed, NT screen and NIPS: ordered. Ultrasound discussed; fetal anatomic survey: ordered. Problem list reviewed and updated. The nature of [place] with multiple MDs and other Advanced Practice Providers was explained to patient; also emphasized that residents, students are part of our team. Routine obstetric precautions reviewed. Return in about 4 weeks (around 07/22/2019) for Virtual OB Visit.     Verita Schneiders, MD, [place] for [employer], [place]

## 2019-06-24 NOTE — Patient Instructions (Addendum)
Thank you for enrolling in MyChart. Please follow the instructions below to securely access your online medical record. MyChart allows you to send messages to your doctor, view your test results, manage appointments, and more.   How Do I Sign Up? 1. In your Internet browser, go to [employer] and enter [email or web address]. 2. Click on the Sign Up Now link in the Sign In box. You will see the New Member Sign Up page. 3. Enter your MyChart Access Code exactly as it appears below. You will not need to use this code after you've completed the sign-up process. If you do not sign up before the expiration date, you must request a new code.  MyChart Access Code: [email or web address] Expires: 08/08/2019  3:38 PM  4. Enter your Social Security Number ([id number]) and Date of Birth (mm/dd/yyyy) as indicated and click Submit. You will be taken to the next sign-up page. 5. Create a MyChart ID. This will be your MyChart login ID and cannot be changed, so think of one that is secure and easy to remember. 6. Create a MyChart password. You can change your password at any time. 7. Enter your Password Reset Question and Answer. This can be used at a later time if you forget your password.  8. Enter your e-mail address. You will receive e-mail notification when new information is available in MyChart. 9. Click Sign Up. You can now view your medical record.   Additional Information Remember, MyChart is NOT to be used for urgent needs. For medical emergencies, dial 911.      First Trimester of Pregnancy The first trimester of pregnancy is from week 1 until the end of week 13 (months 1 through 3). A week after a sperm fertilizes an egg, the egg will implant on the wall of the uterus. This embryo will begin to develop into a baby. Genes from you and your partner will form the baby. The female genes will determine whether the baby will be a boy or a girl. At 6-8 weeks, the eyes and face will be formed, and  the heartbeat can be seen on ultrasound. At the end of 12 weeks, all the baby's organs will be formed. Now that you are pregnant, you will want to do everything you can to have a healthy baby. Two of the most important things are to get good prenatal care and to follow your health care provider's instructions. Prenatal care is all the medical care you receive before the baby's birth. This care will help prevent, find, and treat any problems during the pregnancy and childbirth. Body changes during your first trimester Your body goes through many changes during pregnancy. The changes vary from woman to woman.  You may gain or lose a couple of pounds at first.  You may feel sick to your stomach (nauseous) and you may throw up (vomit). If the vomiting is uncontrollable, call your health care provider.  You may tire easily.  You may develop headaches that can be relieved by medicines. All medicines should be approved by your health care provider.  You may urinate more often. Painful urination may mean you have a bladder infection.  You may develop heartburn as a result of your pregnancy.  You may develop constipation because certain hormones are causing the muscles that push stool through your intestines to slow down.  You may develop hemorrhoids or swollen veins (varicose veins).  Your breasts may begin to grow larger and become tender. Your nipples may stick out more,  and the tissue that surrounds them (areola) may become darker.  Your gums may bleed and may be sensitive to brushing and flossing.  Dark spots or blotches (chloasma, mask of pregnancy) may develop on your face. This will likely fade after the baby is born.  Your menstrual periods will stop.  You may have a loss of appetite.  You may develop cravings for certain kinds of food.  You may have changes in your emotions from day to day, such as being excited to be pregnant or being concerned that something may go wrong with the  pregnancy and baby.  You may have more vivid and strange dreams.  You may have changes in your hair. These can include thickening of your hair, rapid growth, and changes in texture. Some women also have hair loss during or after pregnancy, or hair that feels dry or thin. Your hair will most likely return to normal after your baby is born. What to expect at prenatal visits During a routine prenatal visit:  You will be weighed to make sure you and the baby are growing normally.  Your blood pressure will be taken.  Your abdomen will be measured to track your baby's growth.  The fetal heartbeat will be listened to between weeks 10 and 14 of your pregnancy.  Test results from any previous visits will be discussed. Your health care provider may ask you:  How you are feeling.  If you are feeling the baby move.  If you have had any abnormal symptoms, such as leaking fluid, bleeding, severe headaches, or abdominal cramping.  If you are using any tobacco products, including cigarettes, chewing tobacco, and electronic cigarettes.  If you have any questions. Other tests that may be performed during your first trimester include:  Blood tests to find your blood type and to check for the presence of any previous infections. The tests will also be used to check for low iron levels (anemia) and protein on red blood cells (Rh antibodies). Depending on your risk factors, or if you previously had diabetes during pregnancy, you may have tests to check for high blood sugar that affects pregnant women (gestational diabetes).  Urine tests to check for infections, diabetes, or protein in the urine.  An ultrasound to confirm the proper growth and development of the baby.  Fetal screens for spinal cord problems (spina bifida) and Down syndrome.  HIV (human immunodeficiency virus) testing. Routine prenatal testing includes screening for HIV, unless you choose not to have this test.  You may need other  tests to make sure you and the baby are doing well. Follow these instructions at home: Medicines  Follow your health care provider's instructions regarding medicine use. Specific medicines may be either safe or unsafe to take during pregnancy.  Take a prenatal vitamin that contains at least 600 micrograms (mcg) of folic acid.  If you develop constipation, try taking a stool softener if your health care provider approves. Eating and drinking   Eat a balanced diet that includes fresh fruits and vegetables, whole grains, good sources of protein such as meat, eggs, or tofu, and low-fat dairy. Your health care provider will help you determine the amount of weight gain that is right for you.  Avoid raw meat and uncooked cheese. These carry germs that can cause birth defects in the baby.  Eating four or five small meals rather than three large meals a day may help relieve nausea and vomiting. If you start to feel nauseous, eating a  few soda crackers can be helpful. Drinking liquids between meals, instead of during meals, also seems to help ease nausea and vomiting.  Limit foods that are high in fat and processed sugars, such as fried and sweet foods.  To prevent constipation: ? Eat foods that are high in fiber, such as fresh fruits and vegetables, whole grains, and beans. ? Drink enough fluid to keep your urine clear or pale yellow. Activity  Exercise only as directed by your health care provider. Most women can continue their usual exercise routine during pregnancy. Try to exercise for 30 minutes at least 5 days a week. Exercising will help you: ? Control your weight. ? Stay in shape. ? Be prepared for labor and delivery.  Experiencing pain or cramping in the lower abdomen or lower back is a good sign that you should stop exercising. Check with your health care provider before continuing with normal exercises.  Try to avoid standing for long periods of time. Move your legs often if you must  stand in one place for a long time.  Avoid heavy lifting.  Wear low-heeled shoes and practice good posture.  You may continue to have sex unless your health care provider tells you not to. Relieving pain and discomfort  Wear a good support bra to relieve breast tenderness.  Take warm sitz baths to soothe any pain or discomfort caused by hemorrhoids. Use hemorrhoid cream if your health care provider approves.  Rest with your legs elevated if you have leg cramps or low back pain.  If you develop varicose veins in your legs, wear support hose. Elevate your feet for 15 minutes, 3-4 times a day. Limit salt in your diet. Prenatal care  Schedule your prenatal visits by the twelfth week of pregnancy. They are usually scheduled monthly at first, then more often in the last 2 months before delivery.  Write down your questions. Take them to your prenatal visits.  Keep all your prenatal visits as told by your health care provider. This is important. Safety  Wear your seat belt at all times when driving.  Make a list of emergency phone numbers, including numbers for family, friends, the hospital, and police and fire departments. General instructions  Ask your health care provider for a referral to a local prenatal education class. Begin classes no later than the beginning of month 6 of your pregnancy.  Ask for help if you have counseling or nutritional needs during pregnancy. Your health care provider can offer advice or refer you to specialists for help with various needs.  Do not use hot tubs, steam rooms, or saunas.  Do not douche or use tampons or scented sanitary pads.  Do not cross your legs for long periods of time.  Avoid cat litter boxes and soil used by cats. These carry germs that can cause birth defects in the baby and possibly loss of the fetus by miscarriage or stillbirth.  Avoid all smoking, herbs, alcohol, and medicines not prescribed by your health care provider.  Chemicals in these products affect the formation and growth of the baby.  Do not use any products that contain nicotine or tobacco, such as cigarettes and e-cigarettes. If you need help quitting, ask your health care provider. You may receive counseling support and other resources to help you quit.  Schedule a dentist appointment. At home, brush your teeth with a soft toothbrush and be gentle when you floss. Contact a health care provider if:  You have dizziness.  You have  mild pelvic cramps, pelvic pressure, or nagging pain in the abdominal area.  You have persistent nausea, vomiting, or diarrhea.  You have a bad smelling vaginal discharge.  You have pain when you urinate.  You notice increased swelling in your face, hands, legs, or ankles.  You are exposed to fifth disease or chickenpox.  You are exposed to [place] measles (rubella) and have never had it. Get help right away if:  You have a fever.  You are leaking fluid from your vagina.  You have spotting or bleeding from your vagina.  You have severe abdominal cramping or pain.  You have rapid weight gain or loss.  You vomit blood or material that looks like coffee grounds.  You develop a severe headache.  You have shortness of breath.  You have any kind of trauma, such as from a fall or a car accident. Summary  The first trimester of pregnancy is from week 1 until the end of week 13 (months 1 through 3).  Your body goes through many changes during pregnancy. The changes vary from woman to woman.  You will have routine prenatal visits. During those visits, your health care provider will examine you, discuss any test results you may have, and talk with you about how you are feeling. This information is not intended to replace advice given to you by your health care provider. Make sure you discuss any questions you have with your health care provider. Document Released: 11/29/2001 Document Revised: 11/17/2017  Document Reviewed: 11/16/2016 Elsevier Patient Education  2020 [employer]

## 2019-06-25 LAB — OBSTETRIC PANEL, INCLUDING HIV
Antibody Screen: NEGATIVE
Basophils Absolute: 0 10*3/uL (ref 0.0–0.2)
Basos: 0 %
EOS (ABSOLUTE): 0 10*3/uL (ref 0.0–0.4)
Eos: 1 %
HIV Screen 4th Generation wRfx: NONREACTIVE
Hematocrit: 34.5 % (ref 34.0–46.6)
Hemoglobin: 10.7 g/dL — ABNORMAL LOW (ref 11.1–15.9)
Hepatitis B Surface Ag: NEGATIVE
Immature Grans (Abs): 0 10*3/uL (ref 0.0–0.1)
Immature Granulocytes: 0 %
Lymphocytes Absolute: 1.3 10*3/uL (ref 0.7–3.1)
Lymphs: 17 %
MCH: 22.3 pg — ABNORMAL LOW (ref 26.6–33.0)
MCHC: 31 g/dL — ABNORMAL LOW (ref 31.5–35.7)
MCV: 72 fL — ABNORMAL LOW (ref 79–97)
Monocytes Absolute: 0.8 10*3/uL (ref 0.1–0.9)
Monocytes: 10 %
Neutrophils Absolute: 5.4 10*3/uL (ref 1.4–7.0)
Neutrophils: 72 %
Platelets: 427 10*3/uL (ref 150–450)
RBC: 4.8 x10E6/uL (ref 3.77–5.28)
RDW: 15.7 % — ABNORMAL HIGH (ref 11.7–15.4)
RPR Ser Ql: NONREACTIVE
Rh Factor: POSITIVE
Rubella Antibodies, IGG: <0.9 index — ABNORMAL LOW (ref >0.99)
WBC: 7.5 10*3/uL (ref 3.4–10.8)

## 2019-06-25 LAB — COMPREHENSIVE METABOLIC PANEL
ALT: 27 IU/L (ref 0–32)
AST: 21 IU/L (ref 0–40)
Albumin/Globulin Ratio: 1.2 (ref 1.2–2.2)
Albumin: 4.4 g/dL (ref 3.9–5.0)
Alkaline Phosphatase: 66 IU/L (ref 39–117)
BUN/Creatinine Ratio: 11 (ref 9–23)
BUN: 6 mg/dL (ref 6–20)
Bilirubin Total: 0.3 mg/dL (ref 0.0–1.2)
CO2: 21 mmol/L (ref 20–29)
Calcium: 9.8 mg/dL (ref 8.7–10.2)
Chloride: 97 mmol/L (ref 96–106)
Creatinine, Ser: 0.57 mg/dL (ref 0.57–1.00)
GFR calc Af Amer: 145 mL/min/{1.73_m2} (ref >59)
GFR calc non Af Amer: 126 mL/min/{1.73_m2} (ref >59)
Globulin, Total: 3.6 g/dL (ref 1.5–4.5)
Glucose: 81 mg/dL (ref 65–99)
Potassium: 3 mmol/L — ABNORMAL LOW (ref 3.5–5.2)
Sodium: 138 mmol/L (ref 134–144)
Total Protein: 8 g/dL (ref 6.0–8.5)

## 2019-06-25 LAB — TSH: TSH: 0.425 u[IU]/mL — ABNORMAL LOW (ref 0.450–4.500)

## 2019-06-25 LAB — HEMOGLOBIN A1C
Est. average glucose Bld gHb Est-mCnc: 100 mg/dL
Hgb A1c MFr Bld: 5.1 % (ref 4.8–5.6)

## 2019-06-26 LAB — CERVICOVAGINAL ANCILLARY ONLY
Bacterial vaginitis: NEGATIVE
Candida vaginitis: NEGATIVE
Chlamydia: NEGATIVE
Neisseria Gonorrhea: NEGATIVE
Trichomonas: NEGATIVE

## 2019-06-26 LAB — CULTURE, OB URINE

## 2019-06-26 LAB — URINE CULTURE, OB REFLEX

## 2019-07-09 ENCOUNTER — Ambulatory Visit (HOSPITAL_COMMUNITY): Payer: Medicaid Other | Admitting: *Deleted

## 2019-07-09 ENCOUNTER — Ambulatory Visit (HOSPITAL_COMMUNITY): Payer: Medicaid Other

## 2019-07-09 ENCOUNTER — Other Ambulatory Visit: Payer: Self-pay

## 2019-07-09 ENCOUNTER — Encounter (HOSPITAL_COMMUNITY): Payer: Self-pay

## 2019-07-09 ENCOUNTER — Ambulatory Visit (HOSPITAL_COMMUNITY)
Admission: RE | Admit: 2019-07-09 | Discharge: 2019-07-09 | Disposition: A | Discharge status: Home / Self Care | Payer: Medicaid Other | Source: Ambulatory Visit | Attending: Obstetrics and Gynecology | Admitting: Obstetrics and Gynecology

## 2019-07-09 VITALS — BP 118/75 | HR 97 | Temp 98.5°F

## 2019-07-09 DIAGNOSIS — O09292 Supervision of pregnancy with other poor reproductive or obstetric history, second trimester: Secondary | ICD-10-CM

## 2019-07-09 DIAGNOSIS — Z3682 Encounter for antenatal screening for nuchal translucency: Secondary | ICD-10-CM | POA: Diagnosis not present

## 2019-07-09 DIAGNOSIS — Z3480 Encounter for supervision of other normal pregnancy, unspecified trimester: Secondary | ICD-10-CM

## 2019-07-09 DIAGNOSIS — Z3A19 19 weeks gestation of pregnancy: Secondary | ICD-10-CM

## 2019-07-09 DIAGNOSIS — O09299 Supervision of pregnancy with other poor reproductive or obstetric history, unspecified trimester: Secondary | ICD-10-CM | POA: Insufficient documentation

## 2019-07-19 ENCOUNTER — Telehealth: Payer: Self-pay

## 2019-07-19 NOTE — Telephone Encounter (Signed)
Return call to pt regarding message on triage line Pt stated she had questions regarding Appt next week Pt not ava LVMOM

## 2019-07-22 ENCOUNTER — Ambulatory Visit (INDEPENDENT_AMBULATORY_CARE_PROVIDER_SITE_OTHER): Payer: Medicaid Other | Admitting: Advanced Practice Midwife

## 2019-07-22 DIAGNOSIS — Z3480 Encounter for supervision of other normal pregnancy, unspecified trimester: Secondary | ICD-10-CM

## 2019-07-22 DIAGNOSIS — Z3A15 15 weeks gestation of pregnancy: Secondary | ICD-10-CM

## 2019-07-22 DIAGNOSIS — R51 Headache: Secondary | ICD-10-CM

## 2019-07-22 DIAGNOSIS — O26892 Other specified pregnancy related conditions, second trimester: Secondary | ICD-10-CM

## 2019-07-22 MED ORDER — BUTALBITAL-APAP-CAFFEINE 50-325-40 MG PO TABS: 1.0000 | ORAL_TABLET | Freq: Four times a day (QID) | ORAL | 0 refills | Status: AC | PRN

## 2019-07-22 NOTE — Progress Notes (Signed)
Pt presents for Webex visit. Pt identified with 2 patient identifiers. Pt has not received bp cuff kit yet. She states that she has been having frequent headaches. She states that she has taken a few ibuprofen with no relief. Pt advised to take tylenol. No other concerns.

## 2019-07-22 NOTE — Progress Notes (Signed)
TELEHEALTH OBSTETRICS PRENATAL VIRTUAL VIDEO VISIT ENCOUNTER NOTE  [facility]   I connected with Taylor Cole on 07/22/19 at  9:15 AM EDT by WebEx Video Encounter at home and verified that I am speaking with the correct person using two identifiers.   I discussed the limitations, risks, security and privacy concerns of performing an evaluation and management service virtually and the availability of in person appointments. I also discussed with the patient that there may be a patient responsible charge related to this service. The patient expressed understanding and agreed to proceed. Subjective:  Taylor DolphinDorcas Cole is a 28 y.o. G3P1011 at 7966w1d being seen today for ongoing prenatal care.  She is currently monitored for the following issues for this low-risk pregnancy and has Rubella non-immune status, antepartum; SMA carrier status; Supervision of other normal pregnancy, antepartum; Trisomy 21 fetus in previous pregnancy, currently pregnant; and Headache in pregnancy, antepartum, second trimester on their problem list.  Patient reports headache.  Contractions: Not present. Vag. Bleeding: None.  Movement: Present. Denies any leaking of fluid.   The following portions of the patient's history were reviewed and updated as appropriate: allergies, current medications, past family history, past medical history, past social history, past surgical history and problem list.   Objective:  There were no vitals filed for this visit.  Fetal Status:     Movement: Present     General:  Alert, oriented and cooperative. Patient is in no acute distress.  Respiratory: Normal respiratory effort, no problems with respiration noted  Mental Status: Normal mood and affect. Normal behavior. Normal judgment and thought content.  Rest of physical exam deferred due to type of encounter  Imaging: [place] Mfm Fetal Nuchal Translucency  Result Date:  07/09/2019 ----------------------------------------------------------------------  OBSTETRICS REPORT                       (Signed Final 07/09/2019 09:21 am) ---------------------------------------------------------------------- Patient Info  ID #:       [id number]                          D.O.B.:  [birth year] (28 yrs)  Name:       Taylor Cole                   Visit Date: 07/09/2019 08:06 am ---------------------------------------------------------------------- Performed By  Performed By:     Taylor Cole          Ref. Address:     [address]                    [address]                                                             [address]                                                             [address] [phone]  [place]                                                             [phone]  Attending:        Noralee Spaceavi Shankar MD        [facility]  [facility]:      [facility] ---------------------------------------------------------------------- Orders   #  Description                          Code         Ordered By   1  US MFM FETAL NUCHAL                  [phone]      Jaynie CollinsUGONNA ANYANWU      TRANSLUCENCY  ----------------------------------------------------------------------   #  Order #                    Accession #                 Episode #   1  [id number]                  [id number]                  [id number]  ---------------------------------------------------------------------- Indications   [redacted] weeks gestation of pregnancy                Z3A.13   Encounter for antenatal screening for          Z36.3   malformations(Low risk NIPS)   Previous pregnancy complicated by              O09.299   chromosomal abnormality, antepartum (T21)  ---------------------------------------------------------------------- Fetal Evaluation  Num Of Fetuses:         1  Preg. Location:          Intrauterine  Gest. Sac:              Intrauterine  Yolk Sac:               Not visualized  Fetal Pole:             Visualized  Fetal Heart Rate(bpm):  153  Cardiac Activity:       Observed  Placenta:               Posterior  Amniotic Fluid  AFI FV:      Within normal limits ---------------------------------------------------------------------- Biometry  CRL:      78.7  mm     G. Age:  13w 4d                  EDD:   01/10/20 ---------------------------------------------------------------------- OB History  Gravidity:    3         Term:  1        Prem:   0        SAB:   0  TOP:          1       Ectopic:  0        Living: 1 ---------------------------------------------------------------------- Gestational Age  LMP:           13w 2d        Date:  04/07/19                 EDD:   01/12/20  Best:          Dot Been13w 2d     Det. By:  LMP  (04/07/19)          EDD:   01/12/20 ---------------------------------------------------------------------- 1st Trimester Genetic Sonogram Screening  Nuc Trans:       1.5  mm  Nasal Bone:                 Present ---------------------------------------------------------------------- Anatomy  Cranium:               Appears normal         Abdominal Wall:         Appears nml (cord                                                                        insert, abd wall)  Choroid Plexus:        Appears normal         Bladder:                Appears normal  Thoracic:              Appears normal         Upper Extremities:      Visualized  Stomach:               Appears normal, left   Lower Extremities:      Visualized                         sided ---------------------------------------------------------------------- Cervix Uterus Adnexa  Cervix  Normal appearance by transabdominal scan.  Uterus  No abnormality visualized.  Right Ovary  Within normal limits.  Cul De Sac  No free fluid seen.  Adnexa  No abnormality visualized. ----------------------------------------------------------------------  Impression  Ms. Dabbs, G3 P1011 at 13-weeks' gestation, is here for  first-trimester ultrasound. Her previous pregnancy was  complicated by fetal Down syndrome that was confirmed by  amniocentesis. She had termination of pregnancy at 18-19  weeks (Jan 2020).  In the current pregnancy, on cell-free fetal DNA screening,  the risk of Down syndrome is not increased.  On ultrasound, the CRL measurement is consistent with her  previously-established dates and good fetal heart activity is  seen. The nuchal translucency (NT) measures 1.5 millimeters  (not taken for risk calculation), which is normal. Nasal bone is  seen. Fetal anatomy that could be ascertained at this  gestational age is normal.  I reassured the patient of the findings. I discussed the  significance and limitations of cell-free fetal DNA screening  that it has a greater detection rate  for Down syndrome (not all  chromosomes). I also informed her that only chorionic villus  sampling or amniocentesis will give a definitive result on the  fetal karyotype.  Patient opted not to have invasive testing. ---------------------------------------------------------------------- Recommendations  -Patient has an appointment for fetal anatomy scan  (08/19/19). ----------------------------------------------------------------------                  Tama High, MD Electronically Signed Final Report   07/09/2019 09:21 am ----------------------------------------------------------------------   Assessment and Plan:  Pregnancy: [id number] at [redacted]w[redacted]d 1. Headache in pregnancy, antepartum, second trimester --Encouraged to increase PO fluids, get more rest, and Rx for Fioricet for PRN use.  F/U if persists. Pt with n/v early in pregnancy, h/a should improve as eating/drinking fluids improves. - butalbital-acetaminophen-caffeine (FIORICET) 50-325-40 MG tablet; Take 1-2 tablets by mouth every 6 (six) hours as needed for headache.  Dispense: 20 tablet; Refill: 0  2. Supervision  of other normal pregnancy, antepartum --Pt reports early fetal movement, denies cramping, LOF, or vaginal bleeding  -- Discussed possible changes to visits, including televisits, that may occur due to COVID-19.  The office remains open if pt needs to be seen and MAU is open 24 hours/day for OB emergencies.  Preterm labor symptoms and general obstetric precautions including but not limited to vaginal bleeding, contractions, leaking of fluid and fetal movement were reviewed in detail with the patient. I discussed the assessment and treatment plan with the patient. The patient was provided an opportunity to ask questions and all were answered. The patient agreed with the plan and demonstrated an understanding of the instructions. The patient was advised to call back or seek an in-person office evaluation/go to [facility] at [facility] for any urgent or concerning symptoms. Please refer to After Visit Summary for other counseling recommendations.   I provided 10 minutes of face-to-face time during this encounter.  No follow-ups on file.  Future Appointments  Date Time Provider [place]  08/19/2019 10:00 AM [place] [facility]  08/19/2019 10:00 AM [facility] [place] 3 [facility]-M[facility]  08/19/2019 11:00 AM [place] [facility] [place] [facility]    Fatima Blank, [place] for [employer], [place]

## 2019-08-19 ENCOUNTER — Encounter (HOSPITAL_COMMUNITY): Payer: Self-pay

## 2019-08-19 ENCOUNTER — Ambulatory Visit (HOSPITAL_COMMUNITY): Payer: Self-pay | Admitting: Genetic Counselor

## 2019-08-19 ENCOUNTER — Other Ambulatory Visit (HOSPITAL_COMMUNITY): Payer: Self-pay | Admitting: *Deleted

## 2019-08-19 ENCOUNTER — Ambulatory Visit (HOSPITAL_COMMUNITY): Payer: Medicaid Other | Admitting: *Deleted

## 2019-08-19 ENCOUNTER — Ambulatory Visit (HOSPITAL_COMMUNITY)
Admission: RE | Admit: 2019-08-19 | Discharge: 2019-08-19 | Disposition: A | Discharge status: Home / Self Care | Payer: Medicaid Other | Source: Ambulatory Visit | Attending: Obstetrics and Gynecology | Admitting: Obstetrics and Gynecology

## 2019-08-19 ENCOUNTER — Other Ambulatory Visit: Payer: Self-pay

## 2019-08-19 ENCOUNTER — Ambulatory Visit (HOSPITAL_BASED_OUTPATIENT_CLINIC_OR_DEPARTMENT_OTHER): Payer: Medicaid Other | Admitting: Genetic Counselor

## 2019-08-19 VITALS — BP 120/67 | HR 113 | Temp 98.6°F

## 2019-08-19 DIAGNOSIS — O09299 Supervision of pregnancy with other poor reproductive or obstetric history, unspecified trimester: Secondary | ICD-10-CM | POA: Diagnosis not present

## 2019-08-19 DIAGNOSIS — O26892 Other specified pregnancy related conditions, second trimester: Secondary | ICD-10-CM | POA: Insufficient documentation

## 2019-08-19 DIAGNOSIS — Z362 Encounter for other antenatal screening follow-up: Secondary | ICD-10-CM

## 2019-08-19 DIAGNOSIS — Z3A19 19 weeks gestation of pregnancy: Secondary | ICD-10-CM

## 2019-08-19 DIAGNOSIS — Z3480 Encounter for supervision of other normal pregnancy, unspecified trimester: Secondary | ICD-10-CM | POA: Diagnosis not present

## 2019-08-19 DIAGNOSIS — Z315 Encounter for genetic counseling: Secondary | ICD-10-CM | POA: Diagnosis not present

## 2019-08-19 DIAGNOSIS — R51 Headache: Secondary | ICD-10-CM | POA: Insufficient documentation

## 2019-08-19 DIAGNOSIS — R519 Headache, unspecified: Secondary | ICD-10-CM

## 2019-08-19 DIAGNOSIS — O09292 Supervision of pregnancy with other poor reproductive or obstetric history, second trimester: Secondary | ICD-10-CM | POA: Diagnosis not present

## 2019-08-19 DIAGNOSIS — Z363 Encounter for antenatal screening for malformations: Secondary | ICD-10-CM | POA: Diagnosis not present

## 2019-08-19 NOTE — Progress Notes (Signed)
08/19/2019  Elliette Maltese 1991-11-03 MRN: [id number] DOV: 08/19/2019  Ms. Gorniak presented to the [facility] for a genetics consultation regarding her history of trisomy 17 in a previous pregnancy. Ms. Hino came to her appointment alone due to COVID-19 visitor restrictions.   Indication for genetic counseling - Previous pregnancy with trisomy 36  Prenatal history  Ms. Muhlbauer is a [id number], 28 y.o. female. Her current pregnancy has completed [redacted]w[redacted]d (Estimated Date of Delivery: 01/12/20).  Ms. Conger denied exposure to environmental toxins or chemical agents. She denied the use of alcohol, tobacco or street drugs. She reported taking prenatal vitamins. She denied significant viral illnesses, fevers, and bleeding during the course of her pregnancy. Her medical and surgical histories were noncontributory.  Family History  A three generation pedigree was drafted and reviewed. The family history is remarkable for the following:  - Ms. Ferraris had a brother with Down syndrome who passed away at 29 year of age due to a congenital heart defect. Ms. Mcavoy reported that her mother was almost 17 years of age when she had this child. We reviewed that Down syndrome is most often caused by an entire extra copy of chromosome 21. This occurs due toerrors in chromosomal division during the creation of egg and sperm cells in a process called nondisjunction.Given that Ms. Discher's mother was considered to be of advanced maternal age when she had this child, it is likely that Down syndrome occurred due to a random nondisjunction event and likely would not impact the risk of recurrence Ms. Mausolf currently has based on her personal history. See Discussion section for more details.  - Ms. Dilger's husband, [place], had a sister who passed away in her 27s due to a kidney problem. Richmond's mother passed away at age 4 due to a heart problem. Ms. Samad had limited information about the  details of these conditions; thus, risk assessment was limited.  The remaining family histories were reviewed and found to be noncontributory for birth defects, intellectual disability, recurrent pregnancy loss, and known genetic conditions.    The patient's ethnicity is [place]. The father of the pregnancy's ethnicity is [place]. Ashkenazi Jewish ancestry and consanguinity were denied. Pedigree will be scanned under Media.  Discussion  Ms. Parslow was referred for genetic counseling due to her history of a previous pregnancy with trisomy 67, AKA Down syndrome. Amniocentesis in that pregnancy confirmed full trisomy 21 in all cells, consistent with the non-familial form of Down syndrome.  Down syndrome is one of the most common extra chromosome conditions, as approximately 1 in 800 babies are born with this condition. Down syndrome is characterized by adistinctfacial appearance, mild to moderate intellectual disability, and an increased chance for a heart defect. Approximately half of babies with Down syndrome are born with a heart defect that may require surgery after birth. Children with Down syndrome also have an increased chance for thyroid problems, which can range from an underactive to an overactive thyroid. Additionally, low muscle tone, vision problems, and respiratory and ear infections are more common among babies with Down syndrome. There aremany morefeaturesthat can beassociated with Down syndrome; however, it is not possible toaccurately predict all features that would be present in an individual with Down syndromeprenatally.  We reviewed that the risk of having a child with a chromosome condition increases after having a previous pregnancy affected by a chromosomal aneuploidy such as Down syndrome. Since Ms. Cangemi was under the age of 57 during her previous affected pregnancy,  and since she will be under the age of 5 at the time of delivery for the current pregnancy, her risk  of having another pregnancy affected by Down syndrome increases 8.2-fold. Based on her age and in the second trimester, Ms. Digirolamo has a 1 in 775 (0.1%) chance of the current pregnancy having Down syndrome. Her adjusted risk thus increases to approximately 1.1%. Ms. Trentman also has a 2.4-fold increased risk to have a pregnancy affected by a chromosomal aneuploidy other than Down syndrome. Thus, Ms. Worno has an approximately 0.6% chance of having a liveborn child with a different chromosomal aneuploidy, such as trisomy 58 or trisomy 63. These risks have been further refined by Ms. Dudding's negative NIPS results.  We reviewed that Ms. Strada had Panorama NIPS through [facility] that was low-risk for fetal aneuploidies. We reviewed that these results showed a less than 1 in 10,000 risk for trisomies 21, 18 and 13, and monosomy X (Turner syndrome).  In addition, the risk for triploidy and sex chromosome trisomies (47,XXX and 47,XXY) was also low. Ms. Christell Constant elected to have cffDNA analysis for 22q11 deletion syndrome, which was also low risk (1 in 9000). We reviewed that while this testing identifies > 99% of pregnancies with trisomy 56, trisomy 63, sex chromosome trisomies (47,XXX and 47,XXY), and triploidy, it is NOT diagnostic. A positive test result requires confirmation by CVS or amniocentesis, and a negative test result does not rule out a fetal chromosome abnormality. She also understands that this testing does not identify all genetic conditions.  A complete ultrasound was performed today prior to our visit. The ultrasound report will be sent under separate cover. There were no visualized fetal anomalies or markers suggestive of aneuploidy. Results from ultrasound and NIPS are very reassuring that the current fetus is likely not affected by Down syndrome.   Ms. Vanna Scotland was also counseled regarding diagnostic testing via amniocentesis. She was familiar with the procedure since she underwent amniocentesis in her  previous pregnancy. We briefly reviewed the technical aspects of the procedure and quoted up to a 1 in 500 (0.2%) risk for spontaneous pregnancy loss or other adverse pregnancy outcomes as a result of amniocentesis. Cultured cells from an amniocentesis sample allow for the visualization of a fetal karyotype, which can detect >99% of chromosomal aberrations, including Down syndrome. After careful consideration, Ms. Boss declined amniocentesis at this time. She understands that amniocentesis is available at any point after 16 weeks of pregnancy and that she may opt to undergo the procedure at a later date should she change her mind.  Ms. Almas is a carrier for spinal muscular atrophy (SMA), as she only has 1 copy of the SMN1 gene. She has already had genetic counseling for this in a previous pregnancy, and her husband reportedly had negative SMN1 testing. This significantly reduces the chances for Ms. Kasel's pregnancies to be affected by SMA.  Lastly, screening for open neural tube defects (ONTDs) via MS-AFP in the second trimester in addition to level II ultrasound examination is recommended. We reviewed that Ms. Bryden's level II ultrasound did not detect any ONTDs, and that level II ultrasound is able to detect them with 90-95% sensitivity. However, normal results from any of the above options do not guarantee a normal baby, as 3-5% of newborns have some type of birth defect, many of which are not prenatally diagnosable.  Additional screening and diagnostic testing were declined today. She understands that screening tests, including ultrasound, cannot rule out all birth defects or genetic  syndromes. The patient was advised of this limitation and states she still does not want additional testing or screening at this time.   I counseled Ms. Ruacho regarding the above risks and available options. The approximate face-to-face time with the genetic counselor was 30 minutes.  In summary:  Discussed  history of previous pregnancy with trisomy 3621 and options for follow-up testing  Reviewed negative NIPS results  Reduction in risk for Down syndrome, trisomy 7218, trisomy 7313, sex chromosome aneuploidies, and 22q11.2 deletion syndrome  Declined amniocentesis  Reviewed results of ultrasound  No fetal anomalies or markers seen  Reduction in risk for fetal aneuploidy  Offered additional testing and screening  Recommend MS-AFP screening  Reviewed family history concerns   Gershon CraneHaley E Viridiana Spaid, MS Genetic Counselor

## 2019-08-20 ENCOUNTER — Encounter: Payer: Self-pay | Admitting: Obstetrics

## 2019-08-20 ENCOUNTER — Ambulatory Visit (INDEPENDENT_AMBULATORY_CARE_PROVIDER_SITE_OTHER): Payer: Medicaid Other | Admitting: Obstetrics

## 2019-08-20 VITALS — BP 120/83 | HR 102

## 2019-08-20 DIAGNOSIS — Z283 Underimmunization status: Secondary | ICD-10-CM

## 2019-08-20 DIAGNOSIS — O9989 Other specified diseases and conditions complicating pregnancy, childbirth and the puerperium: Secondary | ICD-10-CM

## 2019-08-20 DIAGNOSIS — Z3480 Encounter for supervision of other normal pregnancy, unspecified trimester: Secondary | ICD-10-CM

## 2019-08-20 DIAGNOSIS — Z3A19 19 weeks gestation of pregnancy: Secondary | ICD-10-CM

## 2019-08-20 DIAGNOSIS — O09899 Supervision of other high risk pregnancies, unspecified trimester: Secondary | ICD-10-CM

## 2019-08-20 NOTE — Progress Notes (Addendum)
TELEHEALTH OBSTETRICS PRENATAL VIRTUAL VIDEO VISIT ENCOUNTER NOTE  [facility]   I connected with Taylor Dolphinorcas Saintvil on 08/20/19 at  9:30 AM EDT by WebEx OB MyChart Video Encounter at home and verified that I am speaking with the correct person using two identifiers.   I discussed the limitations, risks, security and privacy concerns of performing an evaluation and management service virtually and the availability of in person appointments. I also discussed with the patient that there may be a patient responsible charge related to this service. The patient expressed understanding and agreed to proceed. Subjective:  Taylor Cole is a 28 y.o. G3P1011 at 6652w2d being seen today for ongoing prenatal care.  She is currently monitored for the following issues for this low-risk pregnancy and has Rubella non-immune status, antepartum; SMA carrier status; Supervision of other normal pregnancy, antepartum; Trisomy 21 fetus in previous pregnancy, currently pregnant; and Headache in pregnancy, antepartum, second trimester on their problem list.  Patient reports no complaints.  Contractions: Irritability. Vag. Bleeding: None.  Movement: Present. Denies any leaking of fluid.   The following portions of the patient's history were reviewed and updated as appropriate: allergies, current medications, past family history, past medical history, past social history, past surgical history and problem list.   Objective:   Vitals:   08/20/19 0923  BP: 120/83  Pulse: (!) 102    Fetal Status:     Movement: Present     General:  Alert, oriented and cooperative. Patient is in no acute distress.  Respiratory: Normal respiratory effort, no problems with respiration noted  Mental Status: Normal mood and affect. Normal behavior. Normal judgment and thought content.  Rest of physical exam deferred due to type of encounter  Imaging: Koreas Mfm Ob Comp + 14 Wk  Result Date:  08/19/2019 ----------------------------------------------------------------------  OBSTETRICS REPORT                       (Signed Final 08/19/2019 11:23 am) ---------------------------------------------------------------------- Patient Info  ID #:       [id number]                          D.O.B.:  Jan 03, 1991 (28 yrs)  Name:       Taylor DolphinRCAS Eisenmenger                   Visit Date: 08/19/2019 10:30 am ---------------------------------------------------------------------- Performed By  Performed By:     Percell BostonHeather Waken          Ref. Address:     [address]                    [address]                                                             [address]                                                             [phone]  [place]                                                             [place]  Attending:        Tama High MD        [facility]  Referred By:      [facility] ---------------------------------------------------------------------- Orders   #  Description                          Code         Ordered By   1  [place] MFM OB COMP + 74 WK               76805.01     Verita Schneiders  ----------------------------------------------------------------------   #  Order #                    Accession #                 Episode #   1  [id number]                  [id number]                  [id number]  ---------------------------------------------------------------------- Indications   [redacted] weeks gestation of pregnancy                Z3A.19   Encounter for antenatal screening for          Z36.3   malformations (low risk NIPS, 10.9FF)   Previous pregnancy complicated by              O09.299   chromosomal abnormality, antepartum (t21)  ---------------------------------------------------------------------- Fetal Evaluation  Num Of Fetuses:         1  Fetal Heart Rate(bpm):  162  Cardiac  Activity:       Observed  Presentation:           Cephalic  Placenta:               Posterior  P. Cord Insertion:      Visualized, central  Amniotic Fluid  AFI FV:      Within normal limits ---------------------------------------------------------------------- Biometry  BPD:      46.4  mm     G. Age:  20w 0d         84  %    CI:        79.74   %    70 - 86  FL/HC:      18.6   %    16.1 - 18.3  HC:      164.2  mm     G. Age:  19w 1d         43  %    HC/AC:      1.10        1.09 - 1.39  AC:       149   mm     G. Age:  20w 1d         78  %    FL/BPD:     65.7   %  FL:       30.5  mm     G. Age:  19w 3d         54  %    FL/AC:      20.5   %    20 - 24  HUM:      29.9  mm     G. Age:  19w 6d         68  %  CER:      19.6  mm     G. Age:  18w 6d         41  %  NFT:       3.6  mm  CM:        3.1  mm  Est. FW:     312  gm    0 lb 11 oz      81  % ---------------------------------------------------------------------- OB History  Gravidity:    3         Term:   1        Prem:   0        SAB:   0  TOP:          1       Ectopic:  0        Living: 1 ---------------------------------------------------------------------- Gestational Age  LMP:           19w 1d        Date:  04/07/19                 EDD:   01/12/20  U/S Today:     19w 5d                                        EDD:   01/08/20  Best:          19w 1d     Det. By:  LMP  (04/07/19)          EDD:   01/12/20 ---------------------------------------------------------------------- Anatomy  Cranium:               Appears normal         Aortic Arch:            Appears normal  Cavum:                 Appears normal         Ductal Arch:            Appears normal  Ventricles:            Appears normal         Diaphragm:              Appears  normal  Choroid Plexus:        Appears normal         Stomach:                Appears normal, left                                                                        sided  Cerebellum:             Appears normal         Abdomen:                Appears normal  Posterior Fossa:       Appears normal         Abdominal Wall:         Appears nml (cord                                                                        insert, abd wall)  Nuchal Fold:           Appears normal         Cord Vessels:           Appears normal (3                                                                        vessel cord)  Face:                  Appears normal         Kidneys:                Appear normal                         (orbits and profile)  Lips:                  Appears normal         Bladder:                Appears normal  Thoracic:              Appears normal         Spine:                  Not well visualized  Heart:                 Appears normal         Upper Extremities:      Appears normal                         (4CH,  axis, and                         situs)  RVOT:                  Appears normal         Lower Extremities:      Appears normal  LVOT:                  Appears normal  Other:  Fetus appears to be female. Heels and Rt 5th digit visualized. Nasal          bone visualized. Technically difficult due to fetal position. ---------------------------------------------------------------------- Cervix Uterus Adnexa  Cervix  Normal appearance by transabdominal scan.  Uterus  No abnormality visualized.  Left Ovary  No adnexal mass visualized.  Right Ovary  No adnexal mass visualized.  Cul De Sac  No free fluid seen.  Adnexa  No abnormality visualized. ---------------------------------------------------------------------- Impression  Patient returned for fetal anatomy scan.  Her previous pregnancy was complicated by fetal Down  syndrome that was confirmed by amniocentesis. She had  termination of pregnancy at 18-19 weeks (Jan 2020).  In the current pregnancy, on cell-free fetal DNA screening,  the risk of Down syndrome is not increased.  We performed fetal anatomy scan. No makers of  aneuploidies or  fetal structural defects are seen. Fetal  biometry is consistent with her previously-established dates.  Amniotic fluid is normal and good fetal activity is seen.  Patient understands the limitations of ultrasound in detecting  fetal anomalies and opted not to have amniocentesis.  We reassured the patient of the findings. ---------------------------------------------------------------------- Recommendations  -An appointment was made for her to return in 4 weeks for  completion of fetal anatomy (fetal spine). ----------------------------------------------------------------------                  Noralee Spaceavi Shankar, MD Electronically Signed Final Report   08/19/2019 11:23 am ----------------------------------------------------------------------   Assessment and Plan:  Pregnancy: G3P1011 at 7562w2d 1. Supervision of other normal pregnancy, antepartum   2. Rubella non-immune status, antepartum - Rubella vaccination postpartum   Preterm labor symptoms and general obstetric precautions including but not limited to vaginal bleeding, contractions, leaking of fluid and fetal movement were reviewed in detail with the patient. I discussed the assessment and treatment plan with the patient. The patient was provided an opportunity to ask questions and all were answered. The patient agreed with the plan and demonstrated an understanding of the instructions. The patient was advised to call back or seek an in-person office evaluation/[facility] for any urgent or concerning symptoms. Please refer to After Visit Summary for other counseling recommendations.   I provided 10 minutes of face-to-face time during this encounter.  Return in about 4 weeks (around 09/17/2019) for MyChart.  Future Appointments  Date Time Provider Department Center  09/16/2019  8:15 AM [facility] NURSE [facility]  09/16/2019  8:15 AM [facility] US 4 [facility]US [facility]US    Coral Ceoharles , MD [facility]  [facility] 08/20/2019

## 2019-08-20 NOTE — Progress Notes (Signed)
I connected with  Taylor Cole on 08/20/19 by a video enabled telemedicine application and verified that I am speaking with the correct person using two identifiers.  WebEx ROB, reports no problems today.  She is unable to sign up for MyChart.

## 2019-09-16 ENCOUNTER — Ambulatory Visit (HOSPITAL_COMMUNITY): Payer: Medicaid Other | Admitting: *Deleted

## 2019-09-16 ENCOUNTER — Encounter (HOSPITAL_COMMUNITY): Payer: Self-pay

## 2019-09-16 ENCOUNTER — Ambulatory Visit (HOSPITAL_COMMUNITY)
Admission: RE | Admit: 2019-09-16 | Discharge: 2019-09-16 | Disposition: A | Discharge status: Home / Self Care | Payer: Medicaid Other | Source: Ambulatory Visit | Attending: Obstetrics and Gynecology | Admitting: Obstetrics and Gynecology

## 2019-09-16 ENCOUNTER — Other Ambulatory Visit: Payer: Self-pay

## 2019-09-16 DIAGNOSIS — R51 Headache: Secondary | ICD-10-CM | POA: Insufficient documentation

## 2019-09-16 DIAGNOSIS — O09292 Supervision of pregnancy with other poor reproductive or obstetric history, second trimester: Secondary | ICD-10-CM | POA: Diagnosis not present

## 2019-09-16 DIAGNOSIS — Z3480 Encounter for supervision of other normal pregnancy, unspecified trimester: Secondary | ICD-10-CM | POA: Insufficient documentation

## 2019-09-16 DIAGNOSIS — O26892 Other specified pregnancy related conditions, second trimester: Secondary | ICD-10-CM

## 2019-09-16 DIAGNOSIS — Z3A23 23 weeks gestation of pregnancy: Secondary | ICD-10-CM

## 2019-09-16 DIAGNOSIS — Z362 Encounter for other antenatal screening follow-up: Secondary | ICD-10-CM | POA: Diagnosis not present

## 2019-09-17 ENCOUNTER — Encounter: Payer: Self-pay | Admitting: Obstetrics

## 2019-09-17 ENCOUNTER — Ambulatory Visit (INDEPENDENT_AMBULATORY_CARE_PROVIDER_SITE_OTHER): Payer: Medicaid Other | Admitting: Obstetrics

## 2019-09-17 VITALS — BP 120/78

## 2019-09-17 DIAGNOSIS — Z3A23 23 weeks gestation of pregnancy: Secondary | ICD-10-CM

## 2019-09-17 DIAGNOSIS — O9989 Other specified diseases and conditions complicating pregnancy, childbirth and the puerperium: Secondary | ICD-10-CM

## 2019-09-17 DIAGNOSIS — Z3480 Encounter for supervision of other normal pregnancy, unspecified trimester: Secondary | ICD-10-CM

## 2019-09-17 DIAGNOSIS — Z2839 Other underimmunization status: Secondary | ICD-10-CM

## 2019-09-17 DIAGNOSIS — Z283 Underimmunization status: Secondary | ICD-10-CM

## 2019-09-17 DIAGNOSIS — O09292 Supervision of pregnancy with other poor reproductive or obstetric history, second trimester: Secondary | ICD-10-CM

## 2019-09-17 DIAGNOSIS — O09899 Supervision of other high risk pregnancies, unspecified trimester: Secondary | ICD-10-CM

## 2019-09-17 DIAGNOSIS — O09299 Supervision of pregnancy with other poor reproductive or obstetric history, unspecified trimester: Secondary | ICD-10-CM

## 2019-09-17 NOTE — Progress Notes (Signed)
Pt is on the phone preparing for virtual visit with provider. [redacted]w[redacted]d.  

## 2019-09-17 NOTE — Progress Notes (Signed)
[facility] VIRTUAL VIDEO VISIT ENCOUNTER NOTE  Provider location: [facility] at [place]   I connected with Taylor Cole on 09/17/19 at  9:15 AM EDT by WebEx OB  Video Encounter at home and verified that I am speaking with the correct person using two identifiers.   I discussed the limitations, risks, security and privacy concerns of performing an evaluation and management service virtually and the availability of in person appointments. I also discussed with the patient that there may be a patient responsible charge related to this service. The patient expressed understanding and agreed to proceed. Subjective:  Taylor Cole is a 28 y.o. G3P1011 at [redacted]w[redacted]d being seen today for ongoing prenatal care.  She is currently monitored for the following issues for this low-risk pregnancy and has Rubella non-immune status, antepartum; SMA carrier status; Supervision of other normal pregnancy, antepartum; Trisomy 21 fetus in previous pregnancy, currently pregnant; and Headache in pregnancy, antepartum, second trimester on their problem list.  Patient reports no complaints.  Contractions: Not present. Vag. Bleeding: None.  Movement: Present. Denies any leaking of fluid.   The following portions of the patient's history were reviewed and updated as appropriate: allergies, current medications, past family history, past medical history, past social history, past surgical history and problem list.   Objective:   Vitals:   09/17/19 0913  BP: 120/78    Fetal Status:     Movement: Present     General:  Alert, oriented and cooperative. Patient is in no acute distress.  Respiratory: Normal respiratory effort, no problems with respiration noted  Mental Status: Normal mood and affect. Normal behavior. Normal judgment and thought content.  Rest of physical exam deferred due to type of encounter  Imaging: [place] Mfm Ob Comp + 14 Wk  Result Date:  08/19/2019 ----------------------------------------------------------------------  OBSTETRICS REPORT                       (Signed Final 08/19/2019 11:23 am) ---------------------------------------------------------------------- Patient Info  ID #:       [id number]                          D.O.B.:  20-Nov-1991 (28 yrs)  Name:       Taylor Cole                   Visit Date: 08/19/2019 10:30 am ---------------------------------------------------------------------- Performed By  Performed By:     Valda Favia          Ref. Address:     [address]  [place]                                                             [place]  Attending:        Tama High MD        [facility]  Referred By:      [facility] ---------------------------------------------------------------------- Orders   #  Description                          Code         Ordered By   1  [place] MFM OB COMP + 74 WK               76805.01     Verita Schneiders  ----------------------------------------------------------------------   #  Order #                    Accession #                 Episode #   1  [id number]                  [id number]                  [id number]  ---------------------------------------------------------------------- Indications   [redacted] weeks gestation of pregnancy                Z3A.19   Encounter for antenatal screening for          Z36.3   malformations (low risk NIPS, 10.9FF)   Previous pregnancy complicated by              O09.299   chromosomal abnormality, antepartum (t21)  ---------------------------------------------------------------------- Fetal Evaluation  Num Of Fetuses:         1  Fetal Heart Rate(bpm):  162  Cardiac  Activity:       Observed  Presentation:           Cephalic  Placenta:               Posterior  P. Cord Insertion:      Visualized, central  Amniotic Fluid  AFI FV:      Within normal limits ---------------------------------------------------------------------- Biometry  BPD:      46.4  mm     G. Age:  20w 0d         84  %    CI:        79.74   %    70 - 86  FL/HC:      18.6   %    16.1 - 18.3  HC:      164.2  mm     G. Age:  19w 1d         43  %    HC/AC:      1.10        1.09 - 1.39  AC:       149   mm     G. Age:  20w 1d         78  %    FL/BPD:     65.7   %  FL:       30.5  mm     G. Age:  19w 3d         54  %    FL/AC:      20.5   %    20 - 24  HUM:      29.9  mm     G. Age:  19w 6d         68  %  CER:      19.6  mm     G. Age:  18w 6d         41  %  NFT:       3.6  mm  CM:        3.1  mm  Est. FW:     312  gm    0 lb 11 oz      81  % ---------------------------------------------------------------------- OB History  Gravidity:    3         Term:   1        Prem:   0        SAB:   0  TOP:          1       Ectopic:  0        Living: 1 ---------------------------------------------------------------------- Gestational Age  LMP:           19w 1d        Date:  04/07/19                 EDD:   01/12/20  U/S Today:     19w 5d                                        EDD:   01/08/20  Best:          19w 1d     Det. By:  LMP  (04/07/19)          EDD:   01/12/20 ---------------------------------------------------------------------- Anatomy  Cranium:               Appears normal         Aortic Arch:            Appears normal  Cavum:                 Appears normal         Ductal Arch:            Appears normal  Ventricles:            Appears normal         Diaphragm:              Appears  normal  Choroid Plexus:        Appears normal         Stomach:                Appears normal, left                                                                        sided  Cerebellum:             Appears normal         Abdomen:                Appears normal  Posterior Fossa:       Appears normal         Abdominal Wall:         Appears nml (cord                                                                        insert, abd wall)  Nuchal Fold:           Appears normal         Cord Vessels:           Appears normal (3                                                                        vessel cord)  Face:                  Appears normal         Kidneys:                Appear normal                         (orbits and profile)  Lips:                  Appears normal         Bladder:                Appears normal  Thoracic:              Appears normal         Spine:                  Not well visualized  Heart:                 Appears normal         Upper Extremities:      Appears normal                         (4CH,  axis, and                         situs)  RVOT:                  Appears normal         Lower Extremities:      Appears normal  LVOT:                  Appears normal  Other:  Fetus appears to be female. Heels and Rt 5th digit visualized. Nasal          bone visualized. Technically difficult due to fetal position. ---------------------------------------------------------------------- Cervix Uterus Adnexa  Cervix  Normal appearance by transabdominal scan.  Uterus  No abnormality visualized.  Left Ovary  No adnexal mass visualized.  Right Ovary  No adnexal mass visualized.  Cul De Sac  No free fluid seen.  Adnexa  No abnormality visualized. ---------------------------------------------------------------------- Impression  Patient returned for fetal anatomy scan.  Her previous pregnancy was complicated by fetal Down  syndrome that was confirmed by amniocentesis. She had  termination of pregnancy at 18-19 weeks (Jan 2020).  In the current pregnancy, on cell-free fetal DNA screening,  the risk of Down syndrome is not increased.  We performed fetal anatomy scan. No makers of  aneuploidies or  fetal structural defects are seen. Fetal  biometry is consistent with her previously-established dates.  Amniotic fluid is normal and good fetal activity is seen.  Patient understands the limitations of ultrasound in detecting  fetal anomalies and opted not to have amniocentesis.  We reassured the patient of the findings. ---------------------------------------------------------------------- Recommendations  -An appointment was made for her to return in 4 weeks for  completion of fetal anatomy (fetal spine). ----------------------------------------------------------------------                  Noralee Spaceavi Shankar, MD Electronically Signed Final Report   08/19/2019 11:23 am ----------------------------------------------------------------------  [place] Mfm Ob Follow Up  Result Date: 09/17/2019 ----------------------------------------------------------------------  OBSTETRICS REPORT                        (Signed Final 09/17/2019 07:04 am) ---------------------------------------------------------------------- Patient Info  ID #:       [id number]                          D.O.B.:  1991-09-11 (28 yrs)  Name:       Taylor Cole                   Visit Date: 09/16/2019 08:08 am ---------------------------------------------------------------------- Performed By  Performed By:     Eden Lathearrie Stalter BS      Ref. Address:      [address] [place]                    [address]                                                              [address]  [address]                                                              [place]                                                              [address]  Attending:        Lin Landsman      Location[facility]                    MD                                        [facility]  Referred By:      [facility] ---------------------------------------------------------------------- Orders   #  Description                           Code         Ordered By   1  [place] MFM OB FOLLOW UP                  [phone]     RAVI SHANKAR  ----------------------------------------------------------------------   #  Order #                    Accession #                 Episode #   1  [id number]                  [id number]                  [id number]  ---------------------------------------------------------------------- Indications   Antenatal follow-up for nonvisualized fetal    Z36.2   anatomy   Previous pregnancy complicated by              O09.299   chromosomal abnormality, antepartum (T21)   - LR NIPS   [redacted] weeks gestation of pregnancy                Z3A.23  ---------------------------------------------------------------------- Fetal Evaluation  Num Of Fetuses:          1  Fetal Heart Rate(bpm):   158  Cardiac Activity:        Observed  Presentation:            Cephalic  Placenta:                Posterior  P. Cord Insertion:       Visualized  Amniotic Fluid  AFI FV:      Within normal limits                              Largest Pocket(cm)  6 ---------------------------------------------------------------------- Biometry  BPD:      57.7  mm     G. Age:  23w 5d         65  %    CI:        75.42   %    70 - 86                                                          FL/HC:       19.4  %    19.2 - 20.8  HC:      210.7  mm     G. Age:  23w 1d         35  %    HC/AC:       1.08       1.05 - 1.21  AC:      194.9  mm     G. Age:  24w 1d         74  %    FL/BPD:      70.9  %    71 - 87  FL:       40.9  mm     G. Age:  23w 2d         42  %    FL/AC:       21.0  %    20 - 24  HUM:        37  mm     G. Age:  22w 6d         38  %  CER:      25.7  mm     G. Age:  23w 5d         58  %  Est. FW:     621   gm     1 lb 6 oz     70  % ---------------------------------------------------------------------- OB History  Gravidity:    3         Term:   1        Prem:   0        SAB:   0  TOP:          1       Ectopic:  0        Living: 1  ---------------------------------------------------------------------- Gestational Age  LMP:           23w 1d        Date:  04/07/19                 EDD:   01/12/20  U/S Today:     23w 4d                                        EDD:   01/09/20  Best:          23w 1d     Det. By:  LMP  (04/07/19)          EDD:   01/12/20 ---------------------------------------------------------------------- Anatomy  Cranium:               Appears normal  LVOT:                   Appears normal  Cavum:                 Appears normal         Aortic Arch:            Appears normal  Ventricles:            Appears normal         Ductal Arch:            Previously seen  Choroid Plexus:        Appears normal         Diaphragm:              Appears normal  Cerebellum:            Appears normal         Stomach:                Appears normal, left                                                                        sided  Posterior Fossa:       Appears normal         Abdomen:                Appears normal  Nuchal Fold:           Previously seen        Abdominal Wall:         Appears nml (cord                                                                        insert, abd wall)  Face:                  Appears normal         Cord Vessels:           Appears normal (3                         (orbits and profile)                           vessel cord)  Lips:                  Appears normal         Kidneys:                Appear normal  Palate:                Appears normal         Bladder:                Appears normal  Thoracic:  Appears normal         Spine:                  Appears normal  Heart:                 Appears normal         Upper Extremities:      Previously seen                         (4CH, axis, and                         situs)  RVOT:                  Appears normal         Lower Extremities:      Previously seen  Other:  Heels and Rt 5th digit previously visualized. Nasal bone visualized.  ---------------------------------------------------------------------- Cervix Uterus Adnexa  Cervix  Length:            4.3  cm.  Normal appearance by transabdominal scan.  Uterus  No abnormality visualized.  Left Ovary  Within normal limits.  Right Ovary  Within normal limits.  Cul De Sac  No free fluid seen.  Adnexa  No abnormality visualized. ---------------------------------------------------------------------- Impression  Normal interval growth. ---------------------------------------------------------------------- Recommendations  Follow up as clinically indicated. ----------------------------------------------------------------------               Lin Landsman, MD Electronically Signed Final Report   09/17/2019 07:04 am ----------------------------------------------------------------------   Assessment and Plan:  Pregnancy: G3P1011 at [redacted]w[redacted]d 1. Supervision of other normal pregnancy, antepartum  2. Trisomy 21 in pregnancy, currently pregnant  3. Rubella non-immune status, antepartum - rubella vaccination postpartum   Preterm labor symptoms and general obstetric precautions including but not limited to vaginal bleeding, contractions, leaking of fluid and fetal movement were reviewed in detail with the patient. I discussed the assessment and treatment plan with the patient. The patient was provided an opportunity to ask questions and all were answered. The patient agreed with the plan and demonstrated an understanding of the instructions. The patient was advised to call back or seek an in-person office evaluation/go to [facility] at [facility] for any urgent or concerning symptoms. Please refer to After Visit Summary for other counseling recommendations.   I provided 10 minutes of face-to-face time during this encounter.  Return in about 4 weeks (around 10/15/2019) for ROB, 2 hour OGTT.   Coral Ceo, MD [facility], [facility] 09/17/2019

## 2019-10-14 ENCOUNTER — Ambulatory Visit (INDEPENDENT_AMBULATORY_CARE_PROVIDER_SITE_OTHER): Payer: Medicaid Other | Admitting: Obstetrics & Gynecology

## 2019-10-14 ENCOUNTER — Other Ambulatory Visit: Payer: Medicaid Other

## 2019-10-14 ENCOUNTER — Other Ambulatory Visit: Payer: Self-pay

## 2019-10-14 VITALS — BP 130/82 | HR 103 | Wt 165.7 lb

## 2019-10-14 DIAGNOSIS — Z3480 Encounter for supervision of other normal pregnancy, unspecified trimester: Secondary | ICD-10-CM

## 2019-10-14 DIAGNOSIS — Z23 Encounter for immunization: Secondary | ICD-10-CM | POA: Diagnosis not present

## 2019-10-14 DIAGNOSIS — Z3A27 27 weeks gestation of pregnancy: Secondary | ICD-10-CM

## 2019-10-14 DIAGNOSIS — Z3482 Encounter for supervision of other normal pregnancy, second trimester: Secondary | ICD-10-CM

## 2019-10-14 MED ORDER — TETANUS-DIPHTH-ACELL PERTUSSIS 5-2.5-18.5 LF-MCG/0.5 IM SUSP
0.5000 mL | Freq: Once | INTRAMUSCULAR | Status: AC
  Administered 2019-10-14: 09:00:00 0.5 mL via INTRAMUSCULAR

## 2019-10-14 NOTE — Progress Notes (Signed)
   PRENATAL VISIT NOTE  Subjective:  Taylor Cole is a 28 y.o. G3P1011 at [redacted]w[redacted]d being seen today for ongoing prenatal care.  She is currently monitored for the following issues for this low-risk pregnancy and has Rubella non-immune status, antepartum; SMA carrier status; Supervision of other normal pregnancy, antepartum; Trisomy 21 fetus in previous pregnancy, currently pregnant; and Headache in pregnancy, antepartum, second trimester on their problem list.  Patient reports no complaints.  Contractions: Irritability. Vag. Bleeding: None.  Movement: Present. Denies leaking of fluid.   The following portions of the patient's history were reviewed and updated as appropriate: allergies, current medications, past family history, past medical history, past social history, past surgical history and problem list.   Objective:   Vitals:   10/14/19 0825  BP: 130/82  Pulse: (!) 103  Weight: 165 lb 11.2 oz (75.2 kg)    Fetal Status: Fetal Heart Rate (bpm): 160   Movement: Present     General:  Alert, oriented and cooperative. Patient is in no acute distress.  Skin: Skin is warm and dry. No rash noted.   Cardiovascular: Normal heart rate noted  Respiratory: Normal respiratory effort, no problems with respiration noted  Abdomen: Soft, gravid, appropriate for gestational age.  Pain/Pressure: Absent     Pelvic: Cervical exam deferred        Extremities: Normal range of motion.  Edema: None  Mental Status: Normal mood and affect. Normal behavior. Normal judgment and thought content.   Assessment and Plan:  Pregnancy: G3P1011 at [redacted]w[redacted]d 1. Supervision of other normal pregnancy, antepartum Routine testing - Glucose Tolerance, 2 Hours w/1 Hour - CBC - HIV Antibody (routine testing w rflx) - RPR - Tdap (BOOSTRIX) injection 0.5 mL  Preterm labor symptoms and general obstetric precautions including but not limited to vaginal bleeding, contractions, leaking of fluid and fetal movement were reviewed in  detail with the patient. Please refer to After Visit Summary for other counseling recommendations.   Return in about 4 weeks (around 11/11/2019).  No future appointments.  Emeterio Reeve, MD

## 2019-10-14 NOTE — Patient Instructions (Signed)

## 2019-10-14 NOTE — Progress Notes (Signed)
Pt presents for ROB. Pt has no concerns today. TDAP given. Flu vaccine declined

## 2019-10-15 ENCOUNTER — Encounter: Payer: Medicaid Other | Admitting: Obstetrics and Gynecology

## 2019-10-15 LAB — CBC
Hematocrit: 24.7 % — ABNORMAL LOW (ref 34.0–46.6)
Hemoglobin: 7.5 g/dL — ABNORMAL LOW (ref 11.1–15.9)
MCH: 20.6 pg — ABNORMAL LOW (ref 26.6–33.0)
MCHC: 30.4 g/dL — ABNORMAL LOW (ref 31.5–35.7)
MCV: 68 fL — ABNORMAL LOW (ref 79–97)
Platelets: 285 10*3/uL (ref 150–450)
RBC: 3.64 x10E6/uL — ABNORMAL LOW (ref 3.77–5.28)
RDW: 15.8 % — ABNORMAL HIGH (ref 11.7–15.4)
WBC: 9.4 10*3/uL (ref 3.4–10.8)

## 2019-10-15 LAB — GLUCOSE TOLERANCE, 2 HOURS W/ 1HR
Glucose, 1 hour: 127 mg/dL (ref 65–179)
Glucose, 2 hour: 138 mg/dL (ref 65–152)
Glucose, Fasting: 68 mg/dL (ref 65–91)

## 2019-10-15 LAB — HIV ANTIBODY (ROUTINE TESTING W REFLEX): HIV Screen 4th Generation wRfx: NONREACTIVE

## 2019-10-15 LAB — RPR: RPR Ser Ql: NONREACTIVE

## 2019-10-21 ENCOUNTER — Other Ambulatory Visit: Payer: Self-pay | Admitting: Obstetrics and Gynecology

## 2019-10-21 DIAGNOSIS — O99013 Anemia complicating pregnancy, third trimester: Secondary | ICD-10-CM | POA: Insufficient documentation

## 2019-10-21 MED ORDER — SODIUM CHLORIDE 0.9 % IV SOLN: 510.0000 mg | INTRAVENOUS | Status: AC

## 2019-10-28 ENCOUNTER — Other Ambulatory Visit (HOSPITAL_COMMUNITY): Payer: Self-pay | Admitting: *Deleted

## 2019-10-29 ENCOUNTER — Ambulatory Visit (HOSPITAL_COMMUNITY)
Admission: RE | Admit: 2019-10-29 | Discharge: 2019-10-29 | Disposition: A | Discharge status: Home / Self Care | Payer: Medicaid Other | Source: Ambulatory Visit | Attending: Obstetrics and Gynecology | Admitting: Obstetrics and Gynecology

## 2019-10-29 ENCOUNTER — Other Ambulatory Visit: Payer: Self-pay

## 2019-10-29 DIAGNOSIS — D649 Anemia, unspecified: Secondary | ICD-10-CM | POA: Diagnosis not present

## 2019-10-29 DIAGNOSIS — Z3A00 Weeks of gestation of pregnancy not specified: Secondary | ICD-10-CM | POA: Insufficient documentation

## 2019-10-29 DIAGNOSIS — O99019 Anemia complicating pregnancy, unspecified trimester: Secondary | ICD-10-CM | POA: Insufficient documentation

## 2019-10-29 MED ORDER — ACETAMINOPHEN 500 MG PO TABS
1000.0000 mg | ORAL_TABLET | Freq: Once | ORAL | Status: AC
  Administered 2019-10-29: 12:00:00 1000 mg via ORAL

## 2019-10-29 MED ORDER — ACETAMINOPHEN 500 MG PO TABS
ORAL_TABLET | ORAL | Status: AC
  Administered 2019-10-29: 1000 mg via ORAL
  Filled 2019-10-29: qty 2

## 2019-10-29 MED ORDER — DIPHENHYDRAMINE HCL 25 MG PO CAPS
25.0000 mg | ORAL_CAPSULE | Freq: Once | ORAL | Status: AC
  Administered 2019-10-29: 12:00:00 25 mg via ORAL

## 2019-10-29 MED ORDER — SODIUM CHLORIDE 0.9 % IV SOLN
510.0000 mg | INTRAVENOUS | Status: DC
  Administered 2019-10-29: 510 mg via INTRAVENOUS
  Filled 2019-10-29: qty 17

## 2019-10-29 MED ORDER — DIPHENHYDRAMINE HCL 25 MG PO CAPS
ORAL_CAPSULE | ORAL | Status: AC
  Administered 2019-10-29: 12:00:00 25 mg via ORAL
  Filled 2019-10-29: qty 1

## 2019-10-29 MED ORDER — SODIUM CHLORIDE 0.9 % IV SOLN
Freq: Once | INTRAVENOUS | Status: AC
  Administered 2019-10-29: 12:00:00 via INTRAVENOUS

## 2019-10-29 NOTE — Discharge Instructions (Signed)

## 2019-10-29 NOTE — Progress Notes (Signed)
1137 Arrived to bedside to evaluate this 28 yo G3P1 @ 29.[redacted] wks GA in for Iron infusion.  [facility] staff report patient having an allergic reaction.  Pt c/o of SOB and is diaphoretic, tachycardic and tachypneic. 73 Dr. Ilda Basset notified of above. Orders for 1 L NS bolus, tylenol 1 gm, and benadryl 25 mg. Per Dr. Ilda Basset EFM can be discontinued once FHR is reactive.

## 2019-10-29 NOTE — Progress Notes (Signed)
I liter normal saline given along with tylenol 1 gram po and benadryl 25mg  po per on call OB  Dr Ilda Basset

## 2019-10-29 NOTE — Progress Notes (Signed)
Called patient's OB DR Constant to make her aware of the reaction to the feraheme.  Awaiting return call.

## 2019-10-29 NOTE — Progress Notes (Signed)
Mother and baby with stable VS.  1 liter of NS finished.  Pt denies any complaints and stated she feels much better.  Pt DC home and instructed if she has any issues to call her OBGYN

## 2019-10-29 NOTE — Progress Notes (Signed)
Pt stated she feels better and is looking better.

## 2019-10-29 NOTE — Progress Notes (Addendum)
Pt feraheme started, received about 2 minutes of it when she began to clear her throat, diaphoretic, felt nauseous, complained of chest pain, throat swelling,  shortness of breath, and severe back pain.  Stopped feraheme and called rapid response nurse.  Difficulty finding baby's  heart rate.  RRT nurse arrived, started fluids, and placed on heart monitor.  RRT here with patient and calling MD on call.

## 2019-11-05 ENCOUNTER — Encounter (HOSPITAL_COMMUNITY): Payer: Medicaid Other

## 2019-11-11 ENCOUNTER — Ambulatory Visit (INDEPENDENT_AMBULATORY_CARE_PROVIDER_SITE_OTHER): Payer: Medicaid Other | Admitting: Obstetrics & Gynecology

## 2019-11-11 ENCOUNTER — Encounter: Payer: Self-pay | Admitting: Obstetrics & Gynecology

## 2019-11-11 ENCOUNTER — Other Ambulatory Visit: Payer: Self-pay

## 2019-11-11 VITALS — BP 123/80 | HR 108 | Wt 165.0 lb

## 2019-11-11 DIAGNOSIS — O99013 Anemia complicating pregnancy, third trimester: Secondary | ICD-10-CM

## 2019-11-11 DIAGNOSIS — Z3480 Encounter for supervision of other normal pregnancy, unspecified trimester: Secondary | ICD-10-CM

## 2019-11-11 DIAGNOSIS — Z3A31 31 weeks gestation of pregnancy: Secondary | ICD-10-CM

## 2019-11-11 MED ORDER — FERROUS SULFATE 325 (65 FE) MG PO TABS: 325.0000 mg | ORAL_TABLET | Freq: Two times a day (BID) | ORAL | 3 refills | Status: AC

## 2019-11-11 NOTE — Progress Notes (Signed)
Pt is here for ROB. [redacted]w[redacted]d.

## 2019-11-11 NOTE — Patient Instructions (Signed)

## 2019-11-11 NOTE — Progress Notes (Signed)
    PRENATAL VISIT NOTE  Subjective:  Taylor Cole is a 28 y.o. G3P1011 at [redacted]w[redacted]d being seen today for ongoing prenatal care.  She is currently monitored for the following issues for this high-risk pregnancy and has Rubella non-immune status, antepartum; SMA carrier status; Supervision of other normal pregnancy, antepartum; Trisomy 21 fetus in previous pregnancy, currently pregnant; Headache in pregnancy, antepartum, second trimester; and Anemia in pregnancy, third trimester on their problem list.  Patient reports no complaints.  Contractions: Irritability. Vag. Bleeding: None.  Movement: Present. Denies leaking of fluid.   The following portions of the patient's history were reviewed and updated as appropriate: allergies, current medications, past family history, past medical history, past social history, past surgical history and problem list.   Objective:   Vitals:   11/11/19 0828  BP: 123/80  Pulse: (!) 108  Weight: 165 lb (74.8 kg)    Fetal Status: Fetal Heart Rate (bpm): 165 Fundal Height: 31 cm Movement: Present     General:  Alert, oriented and cooperative. Patient is in no acute distress.  Skin: Skin is warm and dry. No rash noted.   Cardiovascular: Normal heart rate noted  Respiratory: Normal respiratory effort, no problems with respiration noted  Abdomen: Soft, gravid, appropriate for gestational age.  Pain/Pressure: Absent     Pelvic: Cervical exam deferred        Extremities: Normal range of motion.  Edema: None  Mental Status: Normal mood and affect. Normal behavior. Normal judgment and thought content.   Assessment and Plan:  Pregnancy: G3P1011 at [redacted]w[redacted]d 1. Supervision of other normal pregnancy, antepartum Anemi, needs FeSO4, had reaction to Feraheme, recheck CBC next  Preterm labor symptoms and general obstetric precautions including but not limited to vaginal bleeding, contractions, leaking of fluid and fetal movement were reviewed in detail with the  patient. Please refer to After Visit Summary for other counseling recommendations.   Return in about 3 weeks (around 12/02/2019).  Future Appointments  Date Time Provider [place]  12/02/2019  9:45 AM Constant, Vickii Chafe, MD [facility] None    Emeterio Reeve, MD

## 2019-12-02 ENCOUNTER — Encounter: Payer: Self-pay | Admitting: Obstetrics and Gynecology

## 2019-12-02 ENCOUNTER — Other Ambulatory Visit: Payer: Self-pay

## 2019-12-02 ENCOUNTER — Ambulatory Visit (INDEPENDENT_AMBULATORY_CARE_PROVIDER_SITE_OTHER): Payer: Medicaid Other | Admitting: Obstetrics and Gynecology

## 2019-12-02 VITALS — BP 135/84 | HR 85 | Wt 171.9 lb

## 2019-12-02 DIAGNOSIS — Z3480 Encounter for supervision of other normal pregnancy, unspecified trimester: Secondary | ICD-10-CM

## 2019-12-02 DIAGNOSIS — Z3A34 34 weeks gestation of pregnancy: Secondary | ICD-10-CM

## 2019-12-02 DIAGNOSIS — O99891 Other specified diseases and conditions complicating pregnancy: Secondary | ICD-10-CM

## 2019-12-02 DIAGNOSIS — Z283 Underimmunization status: Secondary | ICD-10-CM

## 2019-12-02 DIAGNOSIS — O99013 Anemia complicating pregnancy, third trimester: Secondary | ICD-10-CM

## 2019-12-02 DIAGNOSIS — O09899 Supervision of other high risk pregnancies, unspecified trimester: Secondary | ICD-10-CM

## 2019-12-02 NOTE — Progress Notes (Signed)
Pt presents for ROB. Pt states that she is having a lot of pain in her upper thighs. Pt desires to have her hgb checked.

## 2019-12-02 NOTE — Progress Notes (Signed)
   PRENATAL VISIT NOTE  Subjective:  Taylor Cole is a 28 y.o. G3P1011 at [redacted]w[redacted]d being seen today for ongoing prenatal care.  She is currently monitored for the following issues for this low-risk pregnancy and has Rubella non-immune status, antepartum; SMA carrier status; Supervision of other normal pregnancy, antepartum; Trisomy 21 fetus in previous pregnancy, currently pregnant; Headache in pregnancy, antepartum, second trimester; and Anemia in pregnancy, third trimester on their problem list.  Patient reports no complaints.  Contractions: Irregular. Vag. Bleeding: None.  Movement: Present. Denies leaking of fluid.   The following portions of the patient's history were reviewed and updated as appropriate: allergies, current medications, past family history, past medical history, past social history, past surgical history and problem list.   Objective:   Vitals:   12/02/19 1017  BP: 135/84  Pulse: 85  Weight: 171 lb 14.4 oz (78 kg)    Fetal Status: Fetal Heart Rate (bpm): 150 Fundal Height: 34 cm Movement: Present     General:  Alert, oriented and cooperative. Patient is in no acute distress.  Skin: Skin is warm and dry. No rash noted.   Cardiovascular: Normal heart rate noted  Respiratory: Normal respiratory effort, no problems with respiration noted  Abdomen: Soft, gravid, appropriate for gestational age.  Pain/Pressure: Absent     Pelvic: Cervical exam deferred        Extremities: Normal range of motion.  Edema: None  Mental Status: Normal mood and affect. Normal behavior. Normal judgment and thought content.   Assessment and Plan:  Pregnancy: G3P1011 at [redacted]w[redacted]d 1. Supervision of other normal pregnancy, antepartum Patient is doing well without complaints  2. Rubella non-immune status, antepartum Will offer pp  3. Anemia in pregnancy, third trimester Patient desires repeat CBC.  Patient had a reaction to Feraheme infusion but has been taking iron supplement  Preterm labor  symptoms and general obstetric precautions including but not limited to vaginal bleeding, contractions, leaking of fluid and fetal movement were reviewed in detail with the patient. Please refer to After Visit Summary for other counseling recommendations.   Return in about 2 weeks (around 12/16/2019) for in person, ROB/GBS.  Future Appointments  Date Time Provider [place]  12/03/2019 12:15 PM [place], Evelena Asa, DO PP-[facility]EDPED PP    Mora Bellman, MD

## 2019-12-03 ENCOUNTER — Ambulatory Visit (INDEPENDENT_AMBULATORY_CARE_PROVIDER_SITE_OTHER): Payer: Self-pay | Admitting: Pediatrics

## 2019-12-03 DIAGNOSIS — Z7681 Expectant parent(s) prebirth pediatrician visit: Secondary | ICD-10-CM

## 2019-12-03 LAB — CBC
Hematocrit: 31 % — ABNORMAL LOW (ref 34.0–46.6)
Hemoglobin: 9.2 g/dL — ABNORMAL LOW (ref 11.1–15.9)
MCH: 21.5 pg — ABNORMAL LOW (ref 26.6–33.0)
MCHC: 29.7 g/dL — ABNORMAL LOW (ref 31.5–35.7)
MCV: 72 fL — ABNORMAL LOW (ref 79–97)
Platelets: 280 10*3/uL (ref 150–450)
RBC: 4.28 x10E6/uL (ref 3.77–5.28)
RDW: 25.4 % — ABNORMAL HIGH (ref 11.7–15.4)
WBC: 7 10*3/uL (ref 3.4–10.8)

## 2019-12-06 NOTE — Progress Notes (Signed)
Prenatal counseling for impending newborn done-- currently 34wks, 2nd child, recent move from [place] and has 29y/o sibling, no complications, prenatal started 11wks Z76.81

## 2019-12-16 ENCOUNTER — Other Ambulatory Visit (HOSPITAL_COMMUNITY)
Admission: RE | Admit: 2019-12-16 | Discharge: 2019-12-16 | Disposition: A | Discharge status: Home / Self Care | Payer: Medicaid Other | Source: Ambulatory Visit | Attending: Advanced Practice Midwife | Admitting: Advanced Practice Midwife

## 2019-12-16 ENCOUNTER — Ambulatory Visit (INDEPENDENT_AMBULATORY_CARE_PROVIDER_SITE_OTHER): Payer: Medicaid Other | Admitting: Advanced Practice Midwife

## 2019-12-16 ENCOUNTER — Other Ambulatory Visit: Payer: Self-pay

## 2019-12-16 VITALS — BP 131/87 | HR 83 | Wt 172.5 lb

## 2019-12-16 DIAGNOSIS — Z3A36 36 weeks gestation of pregnancy: Secondary | ICD-10-CM

## 2019-12-16 DIAGNOSIS — O99013 Anemia complicating pregnancy, third trimester: Secondary | ICD-10-CM

## 2019-12-16 DIAGNOSIS — Z3480 Encounter for supervision of other normal pregnancy, unspecified trimester: Secondary | ICD-10-CM | POA: Diagnosis not present

## 2019-12-16 DIAGNOSIS — O26843 Uterine size-date discrepancy, third trimester: Secondary | ICD-10-CM

## 2019-12-16 NOTE — Progress Notes (Signed)
   PRENATAL VISIT NOTE  Subjective:  Taylor Cole is a 28 y.o. G3P1011 at [redacted]w[redacted]d being seen today for ongoing prenatal care.  She is currently monitored for the following issues for this low-risk pregnancy and has Rubella non-immune status, antepartum; SMA carrier status; Supervision of other normal pregnancy, antepartum; Trisomy 21 fetus in previous pregnancy, currently pregnant; Headache in pregnancy, antepartum, second trimester; and Anemia in pregnancy, third trimester on their problem list.  Patient reports no complaints.  Contractions: Irregular. Vag. Bleeding: None.  Movement: Present. Denies leaking of fluid.   The following portions of the patient's history were reviewed and updated as appropriate: allergies, current medications, past family history, past medical history, past social history, past surgical history and problem list.   Objective:   Vitals:   12/16/19 0902  BP: 131/87  Pulse: 83  Weight: 172 lb 8 oz (78.2 kg)    Fetal Status: Fetal Heart Rate (bpm): 150 Fundal Height: 39 cm Movement: Present     General:  Alert, oriented and cooperative. Patient is in no acute distress.  Skin: Skin is warm and dry. No rash noted.   Cardiovascular: Normal heart rate noted  Respiratory: Normal respiratory effort, no problems with respiration noted  Abdomen: Soft, gravid, appropriate for gestational age.  Pain/Pressure: Absent     Pelvic: Cervical exam performed Dilation: 2.5 Effacement (%): 50 Station: -2  Extremities: Normal range of motion.  Edema: None  Mental Status: Normal mood and affect. Normal behavior. Normal judgment and thought content.   Assessment and Plan:  Pregnancy: G3P1011 at [redacted]w[redacted]d  1. Supervision of other normal pregnancy, antepartum --Anticipatory guidance about next visits/weeks of pregnancy given. - Strep Gp B NAA - Cervicovaginal ancillary only( [place]) --Next visit in 2 weeks, virtual  2. Anemia affecting pregnancy in third trimester --Pt had  reaction to [facility], is taking daily PO iron and denies any s/sx of anemia --Last Hgb on 12/14 was 9.2  3. Uterine size date discrepancy pregnancy, third trimester --Pt concerned about size, feels much larger than with previous term baby --Pt 2 hour GTT well within normal range --FH 38.5 today, will obtain US for EFW - [place] MFM OB FOLLOW UP; Future  Preterm labor symptoms and general obstetric precautions including but not limited to vaginal bleeding, contractions, leaking of fluid and fetal movement were reviewed in detail with the patient. Please refer to After Visit Summary for other counseling recommendations.   Return in about 2 weeks (around 12/30/2019).  Future Appointments  Date Time Provider [place]  12/25/2019 12:45 PM [place] 2 WH-MFCUS MFC-US  12/25/2019 12:50 PM [place] MFC-US  12/30/2019  9:45 AM Shelly Bombard, MD [place] None    Fatima Blank, CNM

## 2019-12-16 NOTE — Progress Notes (Signed)
ROB/GBS.  C/o contractions.

## 2019-12-16 NOTE — Patient Instructions (Signed)
Labor Precautions Reasons to come to MAU at [facility]:  1.  Contractions are  5 minutes apart or less, each last 1 minute, these have been going on for 1-2 hours, and you cannot walk or talk during them 2.  You have a large gush of fluid, or a trickle of fluid that will not stop and you have to wear a pad 3.  You have bleeding that is bright red, heavier than spotting--like menstrual bleeding (spotting can be normal in early labor or after a check of your cervix) 4.  You do not feel the baby moving like he/she normally does  

## 2019-12-17 LAB — CERVICOVAGINAL ANCILLARY ONLY
Chlamydia: NEGATIVE
Comment: NEGATIVE
Comment: NORMAL
Neisseria Gonorrhea: NEGATIVE

## 2019-12-18 ENCOUNTER — Encounter: Payer: Self-pay | Admitting: Advanced Practice Midwife

## 2019-12-18 DIAGNOSIS — O99820 Streptococcus B carrier state complicating pregnancy: Secondary | ICD-10-CM | POA: Insufficient documentation

## 2019-12-18 LAB — STREP GP B NAA: Strep Gp B NAA: POSITIVE — AB

## 2019-12-20 NOTE — L&D Delivery Note (Addendum)
OB/GYN Faculty Practice Delivery Note  Taylor Cole is a 29 y.o. G3P1011 s/p SVD at [redacted]w[redacted]d. She was admitted for IOL due to gestational hypertension.   ROM: 2h 19m with clear fluid GBS Status: --/Positive (12/28 1000)   Labor Progress: Patient presented to L&D for IOL for gestational hypertension. Initial SVE 0.5. She then progressed to complete with a cytotec, AROM, and pitocin.   Delivery Date/Time: [phone] Delivery: Called to room and patient was complete and pushing. Head delivered without difficulty. No nuchal cord present. Shoulder delivery with gentle downward traction and body delivered in usual fashion. Infant with spontaneous cry, placed on mother's abdomen, dried and stimulated. Cord clamped x 2 after 1-minute delay, and cut by FOB. Cord blood drawn. Placenta delivered spontaneously with gentle cord traction. Fundus firm with massage and Pitocin. Labia, perineum, vagina, and cervix inspected inspected,perineum was intact  Mother and baby stable and bonding.   Placenta: Sent to L&D Complications: None Lacerations: intact QBL: 217 Analgesia: no anesthesia    Infant: APGAR (1 MIN): 9   APGAR (5 MINS): 9   APGAR (10 MINS):     Sherlyn Lees, SNM, RNC-OB  I was present and gloved with the student for the entire delivery. I agree with the above note.   Thressa Sheller DNP, CNM  12/31/19  9:09 AM

## 2019-12-25 ENCOUNTER — Ambulatory Visit (HOSPITAL_COMMUNITY): Payer: Medicaid Other | Admitting: *Deleted

## 2019-12-25 ENCOUNTER — Other Ambulatory Visit: Payer: Self-pay

## 2019-12-25 ENCOUNTER — Ambulatory Visit (HOSPITAL_COMMUNITY)
Admission: RE | Admit: 2019-12-25 | Discharge: 2019-12-25 | Disposition: A | Discharge status: Home / Self Care | Payer: Medicaid Other | Source: Ambulatory Visit | Attending: Obstetrics and Gynecology | Admitting: Obstetrics and Gynecology

## 2019-12-25 ENCOUNTER — Encounter (HOSPITAL_COMMUNITY): Payer: Self-pay

## 2019-12-25 DIAGNOSIS — O26892 Other specified pregnancy related conditions, second trimester: Secondary | ICD-10-CM | POA: Insufficient documentation

## 2019-12-25 DIAGNOSIS — Z3480 Encounter for supervision of other normal pregnancy, unspecified trimester: Secondary | ICD-10-CM | POA: Diagnosis not present

## 2019-12-25 DIAGNOSIS — Z362 Encounter for other antenatal screening follow-up: Secondary | ICD-10-CM

## 2019-12-25 DIAGNOSIS — Z3A37 37 weeks gestation of pregnancy: Secondary | ICD-10-CM

## 2019-12-25 DIAGNOSIS — O09293 Supervision of pregnancy with other poor reproductive or obstetric history, third trimester: Secondary | ICD-10-CM | POA: Diagnosis not present

## 2019-12-25 DIAGNOSIS — O99013 Anemia complicating pregnancy, third trimester: Secondary | ICD-10-CM

## 2019-12-25 DIAGNOSIS — O99820 Streptococcus B carrier state complicating pregnancy: Secondary | ICD-10-CM | POA: Insufficient documentation

## 2019-12-25 DIAGNOSIS — R519 Headache, unspecified: Secondary | ICD-10-CM | POA: Insufficient documentation

## 2019-12-25 DIAGNOSIS — O26843 Uterine size-date discrepancy, third trimester: Secondary | ICD-10-CM | POA: Diagnosis not present

## 2019-12-30 ENCOUNTER — Telehealth: Payer: Self-pay

## 2019-12-30 ENCOUNTER — Encounter: Payer: Self-pay | Admitting: Obstetrics

## 2019-12-30 ENCOUNTER — Telehealth: Payer: Self-pay | Admitting: Obstetrics and Gynecology

## 2019-12-30 ENCOUNTER — Ambulatory Visit (INDEPENDENT_AMBULATORY_CARE_PROVIDER_SITE_OTHER): Payer: Medicaid Other | Admitting: Obstetrics

## 2019-12-30 ENCOUNTER — Encounter (HOSPITAL_COMMUNITY): Payer: Self-pay | Admitting: Obstetrics and Gynecology

## 2019-12-30 ENCOUNTER — Other Ambulatory Visit: Payer: Self-pay

## 2019-12-30 ENCOUNTER — Inpatient Hospital Stay (HOSPITAL_COMMUNITY)
Admission: AD | Admit: 2019-12-30 | Discharge: 2020-01-02 | DRG: 807 | Disposition: A | Discharge status: Home / Self Care | Payer: Medicaid Other | Attending: Family Medicine | Admitting: Family Medicine

## 2019-12-30 VITALS — BP 140/98

## 2019-12-30 DIAGNOSIS — O99820 Streptococcus B carrier state complicating pregnancy: Secondary | ICD-10-CM

## 2019-12-30 DIAGNOSIS — Z3A38 38 weeks gestation of pregnancy: Secondary | ICD-10-CM | POA: Diagnosis not present

## 2019-12-30 DIAGNOSIS — O134 Gestational [pregnancy-induced] hypertension without significant proteinuria, complicating childbirth: Principal | ICD-10-CM | POA: Diagnosis present

## 2019-12-30 DIAGNOSIS — Z3480 Encounter for supervision of other normal pregnancy, unspecified trimester: Secondary | ICD-10-CM

## 2019-12-30 DIAGNOSIS — O99824 Streptococcus B carrier state complicating childbirth: Secondary | ICD-10-CM | POA: Diagnosis not present

## 2019-12-30 DIAGNOSIS — O133 Gestational [pregnancy-induced] hypertension without significant proteinuria, third trimester: Secondary | ICD-10-CM

## 2019-12-30 DIAGNOSIS — D649 Anemia, unspecified: Secondary | ICD-10-CM | POA: Diagnosis present

## 2019-12-30 DIAGNOSIS — O9902 Anemia complicating childbirth: Secondary | ICD-10-CM | POA: Diagnosis not present

## 2019-12-30 DIAGNOSIS — O26892 Other specified pregnancy related conditions, second trimester: Secondary | ICD-10-CM

## 2019-12-30 DIAGNOSIS — Z3483 Encounter for supervision of other normal pregnancy, third trimester: Secondary | ICD-10-CM

## 2019-12-30 DIAGNOSIS — Z20822 Contact with and (suspected) exposure to covid-19: Secondary | ICD-10-CM | POA: Diagnosis present

## 2019-12-30 DIAGNOSIS — O139 Gestational [pregnancy-induced] hypertension without significant proteinuria, unspecified trimester: Secondary | ICD-10-CM | POA: Diagnosis present

## 2019-12-30 DIAGNOSIS — Z3689 Encounter for other specified antenatal screening: Secondary | ICD-10-CM

## 2019-12-30 LAB — COMPREHENSIVE METABOLIC PANEL
ALT: 14 U/L (ref 0–44)
AST: 23 U/L (ref 15–41)
Albumin: 2.8 g/dL — ABNORMAL LOW (ref 3.5–5.0)
Alkaline Phosphatase: 103 U/L (ref 38–126)
Anion gap: 8 (ref 5–15)
BUN: <5 mg/dL — ABNORMAL LOW (ref 6–20)
CO2: 24 mmol/L (ref 22–32)
Calcium: 8.8 mg/dL — ABNORMAL LOW (ref 8.9–10.3)
Chloride: 105 mmol/L (ref 98–111)
Creatinine, Ser: 0.45 mg/dL (ref 0.44–1.00)
GFR calc Af Amer: >60 mL/min (ref >60)
GFR calc non Af Amer: >60 mL/min (ref >60)
Glucose, Bld: 68 mg/dL — ABNORMAL LOW (ref 70–99)
Potassium: 3.6 mmol/L (ref 3.5–5.1)
Sodium: 137 mmol/L (ref 135–145)
Total Bilirubin: 0.4 mg/dL (ref 0.3–1.2)
Total Protein: 6.2 g/dL — ABNORMAL LOW (ref 6.5–8.1)

## 2019-12-30 LAB — CBC
HCT: 35.3 % — ABNORMAL LOW (ref 36.0–46.0)
Hemoglobin: 10.4 g/dL — ABNORMAL LOW (ref 12.0–15.0)
MCH: 22.3 pg — ABNORMAL LOW (ref 26.0–34.0)
MCHC: 29.5 g/dL — ABNORMAL LOW (ref 30.0–36.0)
MCV: 75.6 fL — ABNORMAL LOW (ref 80.0–100.0)
Platelets: 211 10*3/uL (ref 150–400)
RBC: 4.67 MIL/uL (ref 3.87–5.11)
RDW: 22.9 % — ABNORMAL HIGH (ref 11.5–15.5)
WBC: 5.8 10*3/uL (ref 4.0–10.5)
nRBC: 0 % (ref 0.0–0.2)

## 2019-12-30 LAB — ABO/RH: ABO/RH(D): B POS

## 2019-12-30 LAB — PROTEIN / CREATININE RATIO, URINE
Creatinine, Urine: 21.73 mg/dL
Protein Creatinine Ratio: 0.28 mg/mg{Cre} — ABNORMAL HIGH (ref 0.00–0.15)
Total Protein, Urine: 6 mg/dL

## 2019-12-30 LAB — TYPE AND SCREEN
ABO/RH(D): B POS
Antibody Screen: NEGATIVE

## 2019-12-30 LAB — SARS CORONAVIRUS 2 (TAT 6-24 HRS): SARS Coronavirus 2: NEGATIVE

## 2019-12-30 MED ORDER — OXYTOCIN 40 UNITS IN NORMAL SALINE INFUSION - SIMPLE MED
1.0000 m[IU]/min | INTRAVENOUS | Status: DC
  Administered 2019-12-30: 2 m[IU]/min via INTRAVENOUS
  Filled 2019-12-30: qty 1000

## 2019-12-30 MED ORDER — LACTATED RINGERS IV SOLN: 500.0000 mL | INTRAVENOUS | Status: DC | PRN

## 2019-12-30 MED ORDER — OXYTOCIN 40 UNITS IN NORMAL SALINE INFUSION - SIMPLE MED: 2.5000 [IU]/h | INTRAVENOUS | Status: DC

## 2019-12-30 MED ORDER — OXYTOCIN BOLUS FROM INFUSION
500.0000 mL | Freq: Once | INTRAVENOUS | Status: AC
  Administered 2019-12-31: 500 mL via INTRAVENOUS

## 2019-12-30 MED ORDER — SOD CITRATE-CITRIC ACID 500-334 MG/5ML PO SOLN: 30.0000 mL | ORAL | Status: DC | PRN

## 2019-12-30 MED ORDER — TERBUTALINE SULFATE 1 MG/ML IJ SOLN: 0.2500 mg | Freq: Once | INTRAMUSCULAR | Status: DC | PRN

## 2019-12-30 MED ORDER — PENICILLIN G POT IN DEXTROSE 60000 UNIT/ML IV SOLN
3.0000 10*6.[IU] | INTRAVENOUS | Status: DC
  Administered 2019-12-30 – 2019-12-31 (×3): 3 10*6.[IU] via INTRAVENOUS
  Filled 2019-12-30 (×3): qty 50

## 2019-12-30 MED ORDER — SODIUM CHLORIDE 0.9 % IV SOLN
5.0000 10*6.[IU] | Freq: Once | INTRAVENOUS | Status: AC
  Administered 2019-12-30: 19:00:00 5 10*6.[IU] via INTRAVENOUS
  Filled 2019-12-30: qty 5

## 2019-12-30 MED ORDER — LACTATED RINGERS IV SOLN: INTRAVENOUS | Status: DC

## 2019-12-30 MED ORDER — OXYCODONE-ACETAMINOPHEN 5-325 MG PO TABS: 1.0000 | ORAL_TABLET | ORAL | Status: DC | PRN

## 2019-12-30 MED ORDER — OXYCODONE-ACETAMINOPHEN 5-325 MG PO TABS: 2.0000 | ORAL_TABLET | ORAL | Status: DC | PRN

## 2019-12-30 MED ORDER — ONDANSETRON HCL 4 MG/2ML IJ SOLN: 4.0000 mg | Freq: Four times a day (QID) | INTRAMUSCULAR | Status: DC | PRN

## 2019-12-30 MED ORDER — MISOPROSTOL 25 MCG QUARTER TABLET
ORAL_TABLET | ORAL | Status: AC
  Administered 2019-12-30: 25 ug via VAGINAL
  Filled 2019-12-30: qty 1

## 2019-12-30 MED ORDER — MISOPROSTOL 25 MCG QUARTER TABLET: 25.0000 ug | ORAL_TABLET | ORAL | Status: DC | PRN

## 2019-12-30 MED ORDER — ACETAMINOPHEN 325 MG PO TABS: 650.0000 mg | ORAL_TABLET | ORAL | Status: DC | PRN

## 2019-12-30 MED ORDER — LIDOCAINE HCL (PF) 1 % IJ SOLN: 30.0000 mL | INTRAMUSCULAR | Status: DC | PRN

## 2019-12-30 NOTE — H&P (Addendum)
OBSTETRIC ADMISSION HISTORY AND PHYSICAL  Taylor Cole is a 29 y.o. female G20P1011 with IUP at 17w1dby LMP c/w [place] for elevated BP d/t gHTN. Blood pressure elevated today to 140/70 and 170/98 via baby scripts. Since arrival to MAU, blood pressures have remained 140s/90s-100s.  She reports +FMs, No LOF, no VB, no blurry vision, headaches or peripheral edema, and RUQ pain.  She plans on breast feeding. She requests no birth control. [facility] Dating: By LMP c/w 1370w4d [place]->  Estimated Date of Delivery: 01/12/20  Sono:    '@[redacted]w[redacted]d'$ , CWD, normal anatomy, cephalic presentation, posterior placental lie, 312g, 81% EFW  Prenatal History/Complications: gHTN Trisomy 21 in previous pregnancy SMA carrier, FOB is not a carrier Anemia in pregnancy, feraheme  GBS positive  Past Medical History: Past Medical History:  Diagnosis Date  . Medical history non-contributory     Past Surgical History: Past Surgical History:  Procedure Laterality Date  . DILATION AND CURETTAGE OF UTERUS      Obstetrical History: OB History    Gravida  3   Para  1   Term  1   Preterm      AB  1   Living  1     SAB      TAB  1   Ectopic      Multiple      Live Births  1           Social History: Social History   Socioeconomic History  . Marital status: Married    Spouse name: Not on file  . Number of children: Not on file  . Years of education: Not on file  . Highest education level: Not on file  Occupational History  . Not on file  Tobacco Use  . Smoking status: Never Smoker  . Smokeless tobacco: Never Used  Substance and Sexual Activity  . Alcohol use: Never  . Drug use: Never  . Sexual activity: Yes  Other Topics Concern  . Not on file  Social History Narrative  . Not on file   Social Determinants of Health   Financial Resource Strain:   . Difficulty of Paying Living Expenses: Not on file  Food Insecurity:   . Worried  About [employer] fundraiser the Last Year: Not on file  . Ran Out of Food in the Last Year: Not on file  Transportation Needs:   . Lack of Transportation (Medical): Not on file  . Lack of Transportation (Non-Medical): Not on file  Physical Activity:   . Days of Exercise per Week: Not on file  . Minutes of Exercise per Session: Not on file  Stress:   . Feeling of Stress : Not on file  Social Connections:   . Frequency of Communication with Friends and Family: Not on file  . Frequency of Social Gatherings with Friends and Family: Not on file  . Attends Religious Services: Not on file  . Active Member of Clubs or Organizations: Not on file  . Attends CluArchivistetings: Not on file  . Marital Status: Not on file    Family History: Family History  Problem Relation Age of Onset  . Arthritis Mother   . Hypertension Father   . Stroke Father     Allergies: Allergies  Allergen Reactions  . Feraheme [Ferumoxytol] Anaphylaxis    Throat tightening, shortness of breath, chest pain, nausea, severe back pain    Medications  Prior to Admission  Medication Sig Dispense Refill Last Dose  . ferrous sulfate 325 (65 FE) MG tablet Take 1 tablet (325 mg total) by mouth 2 (two) times daily with a meal. 60 tablet 3 12/29/2019 at Unknown time  . folic acid (FOLVITE) 1 MG tablet Take 1 mg by mouth daily.   12/30/2019 at Unknown time  . Prenatal Vit-Fe Fumarate-FA (PREPLUS) 27-1 MG TABS Take 1 tablet by mouth daily. 30 tablet 13 12/30/2019 at Unknown time  . Blood Pressure KIT 1 kit by Does not apply route once a week. Check BP Weekly.  Large Cuff  DX: Z13.6        Z34.86 1 kit 0   . butalbital-acetaminophen-caffeine (FIORICET) 50-325-40 MG tablet Take 1-2 tablets by mouth every 6 (six) hours as needed for headache. (Patient not taking: Reported on 12/16/2019) 20 tablet 0   . Doxylamine-Pyridoxine (DICLEGIS) 10-10 MG TBEC Take 2 tablets by mouth at bedtime. If symptoms persist, add one tablet  in the morning and one in the afternoon 100 tablet 5   . famotidine (PEPCID) 20 MG tablet Take 1 tablet (20 mg total) by mouth 2 (two) times daily. (Patient not taking: Reported on 12/02/2019) 60 tablet 3   . ondansetron (ZOFRAN ODT) 4 MG disintegrating tablet Take 1 tablet (4 mg total) by mouth every 8 (eight) hours as needed for nausea or vomiting. (Patient not taking: Reported on 12/16/2019) 30 tablet 2      Review of Systems   All systems reviewed and negative except as stated in HPI  Blood pressure (!) 142/91, pulse 95, resp. rate 18, height 5' (1.524 m), last menstrual period 04/07/2019, unknown if currently breastfeeding. General appearance: alert, cooperative and no distress Lungs: normal effort Heart: regular rate  Abdomen: soft, non-tender; bowel sounds normal Pelvic: gravid uterus GU: No vaginal lesions  Extremities: Homans sign is negative, no sign of DVT DTR's intact Presentation: cephalic Fetal monitoringBaseline: 150 bpm, Variability: Good {> 6 bpm) and Accelerations: Reactive Uterine activity: Frequency: Every 10 minutes, irregular   Prenatal labs: ABO, Rh: B/Positive/-- (07/06 1533) Antibody: Negative (07/06 1533) Rubella: <0.90 (07/06 1533) RPR: Non Reactive (10/26 0914)  HBsAg: Negative (07/06 1533)  HIV: Non Reactive (10/26 0914)  GBS: --Tessie Fass (12/28 1000)  2 hr Glucola 138 Genetic screening  NIPS low risk, female, normal NT scan Anatomy US WNL  Prenatal Transfer Tool  Maternal Diabetes: No Genetic Screening: Normal, NIPS low risk, female Maternal Ultrasounds/Referrals: Normal Fetal Ultrasounds or other Referrals:  Referred to Materal Fetal Medicine  Maternal Substance Abuse:  No Significant Maternal Medications:  None Significant Maternal Lab Results: Group B Strep positive  No results found for this or any previous visit (from the past 24 hour(s)).  Patient Active Problem List   Diagnosis Date Noted  . GBS (group B Streptococcus carrier), +RV  culture, currently pregnant 12/18/2019  . Anemia in pregnancy, third trimester 10/21/2019  . Headache in pregnancy, antepartum, second trimester 07/22/2019  . Supervision of other normal pregnancy, antepartum 06/24/2019  . Trisomy 21 fetus in previous pregnancy, currently pregnant 06/24/2019  . SMA carrier status 12/20/2018  . Rubella non-immune status, antepartum 11/23/2018    Assessment/Plan:  Taylor Cole is a 29 y.o. G3P1011 at 53w1dhere for IOL d/t gHTN.  #gHTN: Patient with mild to severe range pressures at home, in hospital has only had mild BP elevation to 140s/90s-100s, no need for medications yet. - Monitor vitals - Pre-E labs normal  #GBS positive - PCN ordered q4h  #  RNI - Offer MMR prior to discharge  #Labor: CVE shows fingertip/long/high. Cytotec x1 placed, 50 mg q4h thereafter, #Pain: Per patient request #FWB: Cat I; EFW: 3700g #ID:  GBS positive, PCN ordered #MOF: breastfeeding #MOC: desires none #Circ:  n/a  Gladys Damme, MD [place], PGY-1 12/30/2019, 5:50 PM    GME ATTESTATION:  I saw and evaluated the patient. I agree with the findings and the plan of care as documented in the resident's note.  Merilyn Baba, DO OB Fellow, [place] [facility] [place] 12/30/2019 7:47 PM

## 2019-12-30 NOTE — Progress Notes (Signed)
Taylor Cole is a 29 y.o. G3P1011 at [redacted]w[redacted]d by LMP =US at 13+4 admitted for induction of labor due to elevated BP 2/2 gHTN with no concerning PreE labs.  Subjective: Patient resting comfortably in bed.  No complaints at this time.    Objective: BP 140/87   Pulse 83   Temp 98.3 F (36.8 C) (Oral)   Resp 16   Ht 5' (1.524 m)   Wt 78.2 kg   LMP 04/07/2019 (Exact Date)   BMI 33.67 kg/m   Patient Vitals for the past 24 hrs:  BP Temp Temp src Pulse Resp Height Weight  12/30/19 2326 140/87 98.3 F (36.8 C) Oral 83 16 -- --  12/30/19 2202 135/77 -- -- 89 18 -- --  12/30/19 2031 (!) 142/85 -- -- 92 17 -- --  12/30/19 1922 (!) 148/88 -- -- 91 (P) 18 -- --  12/30/19 1857 (!) 141/88 -- -- 87 -- -- --  12/30/19 1821 139/84 98.2 F (36.8 C) Oral 97 -- -- --  12/30/19 1747 (!) 146/95 -- -- (!) 103 -- 5' (1.524 m) 78.2 kg  12/30/19 1731 (!) 142/91 -- -- 95 -- -- --  12/30/19 1717 (!) 147/100 -- -- 97 18 5' (1.524 m) --  12/30/19 1716 (!) 147/100 -- -- 99 -- -- --   FHT:  FHR: 150 bpm, variability: moderate,  accelerations:  Present,  decelerations:  Absent UC:   irregular, every 3.5-5 minutes SVE:   Dilation: 3 Effacement (%): 80 Station: -2 Exam by:: Dr. Selena Batten  Labs: Lab Results  Component Value Date   WBC 5.8 12/30/2019   HGB 10.4 (L) 12/30/2019   HCT 35.3 (L) 12/30/2019   MCV 75.6 (L) 12/30/2019   PLT 211 12/30/2019   Assessment / Plan: IOL  due to elevated BP 2/2 gHTN with no concerning PreE labs.  Labor: cytotec x 1.  Given 3/80/-2, will continue without Foley balloon and start Pitocin, 2 x 2. GHTN: BP ranges as above. Decreased mIVF to 50cc/hr.  Fetal Wellbeing:  Category I Pain Control:  per patient request I/D:  n/a Anticipated MOD:  VD  Melene Plan 12/30/2019, 11:29 PM

## 2019-12-30 NOTE — Progress Notes (Signed)
TELEHEALTH OBSTETRICS PRENATAL VIRTUAL VIDEO VISIT ENCOUNTER NOTE  Provider location: [facility] [place]   I connected with Taylor Cole on 12/30/19 at  9:45 AM EST by WebEx OB Video Encounter at home and verified that I am speaking with the correct person using two identifiers.   I discussed the limitations, risks, security and privacy concerns of performing an evaluation and management service virtually and the availability of in person appointments. I also discussed with the patient that there may be a patient responsible charge related to this service. The patient expressed understanding and agreed to proceed. Subjective:  Taylor Cole is a 29 y.o. G3P1011 at [redacted]w[redacted]d being seen today for ongoing prenatal care.  She is currently monitored for the following issues for this low-risk pregnancy and has Rubella non-immune status, antepartum; SMA carrier status; Supervision of other normal pregnancy, antepartum; Trisomy 21 fetus in previous pregnancy, currently pregnant; Headache in pregnancy, antepartum, second trimester; Anemia in pregnancy, third trimester; and GBS (group B Streptococcus carrier), +RV culture, currently pregnant on their problem list.  Patient reports backache.  Contractions: Irregular. Vag. Bleeding: None.  Movement: Present. Denies any leaking of fluid.   The following portions of the patient's history were reviewed and updated as appropriate: allergies, current medications, past family history, past medical history, past social history, past surgical history and problem list.   Objective:   Vitals:   12/30/19 0953  BP: (!) 140/98    Fetal Status:     Movement: Present     General:  Alert, oriented and cooperative. Patient is in no acute distress.  Respiratory: Normal respiratory effort, no problems with respiration noted  Mental Status: Normal mood and affect. Normal behavior. Normal judgment and thought content.  Rest of physical exam deferred  due to type of encounter  Imaging: [place] MFM OB FOLLOW UP  Result Date: 12/25/2019 ----------------------------------------------------------------------  OBSTETRICS REPORT                       (Signed Final 12/25/2019 04:11 pm) ---------------------------------------------------------------------- Patient Info  ID #:       [id number]                          D.O.B.:  10-27-1991 (28 yrs)  Name:       Taylor Cole                   Visit Date: 12/25/2019 12:52 pm ---------------------------------------------------------------------- Performed By  Performed By:     Tomma Lightning             Ref. Address:     [address]                    [place]                                                             [address]  [address]                                                             [place]  Attending:        Johnell Comings MD         [facility]  Referred By:      [facility] ---------------------------------------------------------------------- Orders   #  Description                          Code         Ordered By   1  [place] MFM OB FOLLOW UP                  76816.01     LISA LEFTWICH-                                                        KIRBY  ----------------------------------------------------------------------   #  Order #                    Accession #                 Episode #   1  [id number]                  [id number]                  [id number]  ---------------------------------------------------------------------- Indications   Uterine size-date discrepancy, third trimester O26.843   [redacted] weeks gestation of pregnancy                Z3A.37   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 Cord Insertion:      Previously Visualized  Amniotic Fluid  AFI FV:      Within normal limits  AFI Sum(cm)     %Tile       Largest Pocket(cm)  20.94           81          6.48  RUQ(cm)       RLQ(cm)       LUQ(cm)        LLQ(cm)  5.36          3.5           5.6            6.48 ---------------------------------------------------------------------- Biometry  BPD:      87.5  mm     G. Age:  35w 2d         15  %    CI:        77.91   %    70 - 86                                                          FL/HC:      22.9   %    20.8 - 22.6  HC:      313.7  mm     G. Age:  35w 1d          1  %    HC/AC:      0.88        0.92 - 1.05  AC:      355.2  mm     G. Age:  39w 3d         97  %    FL/BPD:     82.1   %    71 - 87  FL:       71.8  mm     G. Age:  36w 5d         33  %    FL/AC:      20.2   %    20 - 24  HUM:      63.1  mm     G. Age:  36w 5d         58  %  Est. FW:    3318  gm      7 lb 5 oz     69  % ---------------------------------------------------------------------- OB History  Gravidity:    3         Term:   1        Prem:   0        SAB:   0  TOP:          1       Ectopic:  0        Living: 1 ---------------------------------------------------------------------- Gestational Age  LMP:           37w 3d        Date:  04/07/19  EDD:   01/12/20  U/S Today:     36w 5d                                        EDD:   01/17/20  Best:          37w 3d     Det. By:  LMP  (04/07/19)          EDD:   01/12/20 ---------------------------------------------------------------------- Anatomy  Cranium:               Appears normal          LVOT:                   Previously seen  Cavum:                 Previously seen        Aortic Arch:            Previously seen  Ventricles:            Previously seen        Ductal Arch:            Previously seen  Choroid Plexus:        Previously seen        Diaphragm:              Appears normal  Cerebellum:            Previously seen        Stomach:                Appears normal, left                                                                        sided  Posterior Fossa:       Previously seen        Abdomen:                Previously seen  Nuchal Fold:           Previously seen        Abdominal Wall:         Previously seen  Face:                  Orbits appear          Cord Vessels:           Previously seen                         normal  Lips:                  Previously seen        Kidneys:                Appear normal  Palate:                Previously seen        Bladder:                Appears normal  Thoracic:  Appears normal         Spine:                  Previously seen  Heart:                 Appears normal         Upper Extremities:      Previously seen                         (4CH, axis, and                         situs)  RVOT:                  Previously seen        Lower Extremities:      Previously seen  Other:  Heels and Rt 5th digit previously visualized. Nasal bone prev          visualized. ---------------------------------------------------------------------- Cervix Uterus Adnexa  Cervix  Not visualized (advanced GA >24wks) ---------------------------------------------------------------------- Comments  This patient was seen for a growth ultrasound as her fundal  heights have been measuring greater than her dates.  She  reports that she has screened negative for gestational  diabetes in her current pregnancy and denies any problems  since her last exam.  She was informed that the fetal growth and amniotic fluid  level appears appropriate for her gestational age.  As the  overall EFW is less than 5000 g, the patient was  advised that she may attempt a vaginal delivery at term  should she desire. ----------------------------------------------------------------------                   Victor Fang, MD Electronically Signed Final Report   12/25/2019 04:11 pm --------------------------------------------------------Ma Rings--------------   Assessment and Plan:  Pregnancy: G3P1011 at 3237w1d 1. Supervision of other normal pregnancy, antepartum  2.  Suspected LGA - ultrasound at 37 weeks reveals EFW at 69th percentile with normal AFI  Term labor symptoms and general obstetric precautions including but not limited to vaginal bleeding, contractions, leaking of fluid and fetal movement were reviewed in detail with the patient. I discussed the assessment and treatment plan with the patient. The patient was provided an opportunity to ask questions and all were answered. The patient agreed with the plan and demonstrated an understanding of the instructions. The patient was advised to call back or seek an in-person office evaluation/[facility] for any urgent or concerning symptoms. Please refer to After Visit Summary for other counseling recommendations.   I provided 10 minutes of face-to-face time during this encounter.  Return in about 1 week (around 01/06/2020) for ROB.    Coral Ceoharles Meara Wiechman, MD [facility], [facility] Bad[facility], Yashar Inclan A, MD 12/30/2019 10:03 AM

## 2019-12-30 NOTE — Telephone Encounter (Signed)
Received a call from babyscripts with elevated BP reading of 140/80 for this patient. Called pt and she reports that she is feeling fine, denies HA or blurry vision. Pt reports good fetal movement, denies contractions, leaking of fluid, or bleeding. I advised pt to recheck BP when she is able to and let us know if it is elevated again. Pt verbalizes understanding.

## 2019-12-30 NOTE — Telephone Encounter (Signed)
Returning patients call, also received a Bab[employer] alert that her BP was 140/97.  Called patient back and asked her to repeat the BP.  That reading was 170/98.  Advised patient to go directly to the MAU at [facility].   Patient agreed.

## 2019-12-30 NOTE — Progress Notes (Signed)
Taylor Cole is a 29 y.o. G3P1011 at [redacted]w[redacted]d by LMP =US at 13+4 admitted for induction of labor due to elevated BP 2/2 gHTN with no concerning PreE labs.  Subjective: Doing well. Denies blurry vision, headaches, n/v, RUQ pain.   Objective: BP (!) 148/88   Pulse 91   Temp 98.2 F (36.8 C) (Oral)   Resp 18   Ht 5' (1.524 m)   Wt 78.2 kg   LMP 04/07/2019 (Exact Date)   BMI 33.67 kg/m   Patient Vitals for the past 24 hrs:  BP Temp Temp src Pulse Resp Height Weight  12/30/19 1922 (!) 148/88 -- -- 91 -- -- --  12/30/19 1857 (!) 141/88 -- -- 87 -- -- --  12/30/19 1821 139/84 98.2 F (36.8 C) Oral 97 -- -- --  12/30/19 1747 (!) 146/95 -- -- (!) 103 -- 5' (1.524 m) 78.2 kg  12/30/19 1731 (!) 142/91 -- -- 95 -- -- --  12/30/19 1717 (!) 147/100 -- -- 97 18 5' (1.524 m) --  12/30/19 1716 (!) 147/100 -- -- 99 -- -- --   FHT:  FHR: 150 bpm, variability: moderate,  accelerations:  Present,  decelerations:  Absent UC:   irregular SVE:   Dilation: Fingertip Effacement (%): Thick Station: Ballotable Exam by:: Dr. Leary Roca  Labs: Lab Results  Component Value Date   WBC 5.8 12/30/2019   HGB 10.4 (L) 12/30/2019   HCT 35.3 (L) 12/30/2019   MCV 75.6 (L) 12/30/2019   PLT 211 12/30/2019    Assessment / Plan:  IOL  due to elevated BP 2/2 gHTN with no concerning PreE labs.  Labor: cytotec x 1 at 1918. Recheck at 2330. GHTN: BP ranges as above. Decreased mIVF to 50cc/hr.  Fetal Wellbeing:  Category I Pain Control:  per patient request I/D:  n/a Anticipated MOD:  VD  Melene Plan 12/30/2019, 8:31 PM

## 2019-12-30 NOTE — MAU Note (Signed)
t had virtual visit today and b/p was elevated 140/970and 170/98. Denies any headache or visual changes.

## 2019-12-30 NOTE — MAU Provider Note (Signed)
History   [id number]   Chief Complaint  Patient presents with  . Hypertension    HPI Taylor Cole is a 29 y.o. female  G3P1011 _0 .1 wks here with report of elevated BP. Denies HA, visual disturbances, RUQ pain, SOB, and CP. She reports good fetal movement. All other systems negative.    Patient's last menstrual period was 04/07/2019 (exact date).  OB History  Gravida Para Term Preterm AB Living  _1 SAB TAB Ectopic Multiple Live Births    1     1    # Outcome Date GA Lbr Len/2nd Weight Sex Delivery Anes PTL Lv  3 Current           2 TAB 01/2019             Birth Comments: Trisomy 21 fetus  1 Term 09/16/16   3200 g F Vag-Spont   LIV    Past Medical History:  Diagnosis Date  . Medical history non-contributory     Family History  Problem Relation Age of Onset  . Arthritis Mother   . Hypertension Father   . Stroke Father     Social History   Socioeconomic History  . Marital status: Married    Spouse name: Not on file  . Number of children: Not on file  . Years of education: Not on file  . Highest education level: Not on file  Occupational History  . Not on file  Tobacco Use  . Smoking status: Never Smoker  . Smokeless tobacco: Never Used  Substance and Sexual Activity  . Alcohol use: Never  . Drug use: Never  . Sexual activity: Yes  Other Topics Concern  . Not on file  Social History Narrative  . Not on file   Social Determinants of Health   Financial Resource Strain:   . Difficulty of Paying Living Expenses: Not on file  Food Insecurity:   . Worried About Charity fundraiser in the Last Year: Not on file  . Ran Out of Food in the Last Year: Not on file  Transportation Needs:   . Lack of Transportation (Medical): Not on file  . Lack of Transportation (Non-Medical): Not on file  Physical Activity:   . Days of Exercise per Week: Not on file  . Minutes of Exercise per Session: Not on file  Stress:   . Feeling of Stress : Not on file   Social Connections:   . Frequency of Communication with Friends and Family: Not on file  . Frequency of Social Gatherings with Friends and Family: Not on file  . Attends Religious Services: Not on file  . Active Member of Clubs or Organizations: Not on file  . Attends Archivist Meetings: Not on file  . Marital Status: Not on file    Allergies  Allergen Reactions  . Feraheme [Ferumoxytol] Anaphylaxis    Throat tightening, shortness of breath, chest pain, nausea, severe back pain    No current facility-administered medications on file prior to encounter.   Current Outpatient Medications on File Prior to Encounter  Medication Sig Dispense Refill  . ferrous sulfate 325 (65 FE) MG tablet Take 1 tablet (325 mg total) by mouth 2 (two) times daily with a meal. 60 tablet 3  . folic acid (FOLVITE) 1 MG tablet Take 1 mg by mouth daily.    . Prenatal Vit-Fe Fumarate-FA (PREPLUS) 27-1 MG TABS Take 1 tablet by mouth daily. 30 tablet  13  . Blood Pressure KIT 1 kit by Does not apply route once a week. Check BP Weekly.  Large Cuff  DX: Z13.6        Z34.86 1 kit 0  . butalbital-acetaminophen-caffeine (FIORICET) 50-325-40 MG tablet Take 1-2 tablets by mouth every 6 (six) hours as needed for headache. (Patient not taking: Reported on 12/16/2019) 20 tablet 0  . Doxylamine-Pyridoxine (DICLEGIS) 10-10 MG TBEC Take 2 tablets by mouth at bedtime. If symptoms persist, add one tablet in the morning and one in the afternoon 100 tablet 5  . famotidine (PEPCID) 20 MG tablet Take 1 tablet (20 mg total) by mouth 2 (two) times daily. (Patient not taking: Reported on 12/02/2019) 60 tablet 3  . ondansetron (ZOFRAN ODT) 4 MG disintegrating tablet Take 1 tablet (4 mg total) by mouth every 8 (eight) hours as needed for nausea or vomiting. (Patient not taking: Reported on 12/16/2019) 30 tablet 2     Review of Systems  Eyes: Negative for visual disturbance.  Respiratory: Negative for shortness of breath.    Cardiovascular: Negative for chest pain.  Gastrointestinal: Negative for abdominal pain.  Genitourinary: Negative for vaginal bleeding.  Neurological: Negative for headaches.     Physical Exam   Vitals:   12/30/19 1716 12/30/19 1717 12/30/19 1731 12/30/19 1747  BP: (!) 147/100 (!) 147/100 (!) 142/91 (!) 146/95  Pulse: 99 97 95 (!) 103  Resp:  18    Height:  5' (1.524 m)     Patient Vitals for the past 24 hrs:  BP Pulse Resp Height  12/30/19 1747 (!) 146/95 (!) 103 -- --  12/30/19 1731 (!) 142/91 95 -- --  12/30/19 1717 (!) 147/100 97 18 5' (1.524 m)  12/30/19 1716 (!) 147/100 99 -- --   Physical Exam  Nursing note and vitals reviewed. Constitutional: She is oriented to person, place, and time. She appears well-developed and well-nourished. No distress.  HENT:  Head: Normocephalic and atraumatic.  Cardiovascular: Normal rate.  Respiratory: Effort normal. No respiratory distress.  Musculoskeletal:        General: Normal range of motion.     Cervical back: Normal range of motion.  Neurological: She is alert and oriented to person, place, and time.  Skin: Skin is warm and dry.  Psychiatric: She has a normal mood and affect.  EFM: 150 bpm, mod variability, + accels, no decels Toco: rare  MAU Course  Procedures  MDM Had elevated BP earlier today with one severe range BP, meets criteria for gHTN. Plan for admit and IOL.   Assessment and Plan  [redacted] weeks gestation Reactive NST Gestational HTN Admit to LD [facility] labs Mngt per Dr. Darene Lamer- notified   Julianne Handler, [facility] 12/30/2019 6:02 PM

## 2019-12-31 ENCOUNTER — Encounter (HOSPITAL_COMMUNITY): Payer: Self-pay | Admitting: Obstetrics & Gynecology

## 2019-12-31 DIAGNOSIS — O99824 Streptococcus B carrier state complicating childbirth: Secondary | ICD-10-CM

## 2019-12-31 DIAGNOSIS — Z3A38 38 weeks gestation of pregnancy: Secondary | ICD-10-CM

## 2019-12-31 DIAGNOSIS — O134 Gestational [pregnancy-induced] hypertension without significant proteinuria, complicating childbirth: Secondary | ICD-10-CM

## 2019-12-31 LAB — CBC
HCT: 37.4 % (ref 36.0–46.0)
Hemoglobin: 11.2 g/dL — ABNORMAL LOW (ref 12.0–15.0)
MCH: 22.4 pg — ABNORMAL LOW (ref 26.0–34.0)
MCHC: 29.9 g/dL — ABNORMAL LOW (ref 30.0–36.0)
MCV: 74.7 fL — ABNORMAL LOW (ref 80.0–100.0)
Platelets: 213 10*3/uL (ref 150–400)
RBC: 5.01 MIL/uL (ref 3.87–5.11)
RDW: 23.5 % — ABNORMAL HIGH (ref 11.5–15.5)
WBC: 8.7 10*3/uL (ref 4.0–10.5)
nRBC: 0 % (ref 0.0–0.2)

## 2019-12-31 LAB — RPR: RPR Ser Ql: NONREACTIVE

## 2019-12-31 MED ORDER — PHENYLEPHRINE 40 MCG/ML (10ML) SYRINGE FOR IV PUSH (FOR BLOOD PRESSURE SUPPORT): 80.0000 ug | PREFILLED_SYRINGE | INTRAVENOUS | Status: DC | PRN

## 2019-12-31 MED ORDER — SIMETHICONE 80 MG PO CHEW: 80.0000 mg | CHEWABLE_TABLET | ORAL | Status: DC | PRN

## 2019-12-31 MED ORDER — ONDANSETRON HCL 4 MG/2ML IJ SOLN: 4.0000 mg | INTRAMUSCULAR | Status: DC | PRN

## 2019-12-31 MED ORDER — TETANUS-DIPHTH-ACELL PERTUSSIS 5-2.5-18.5 LF-MCG/0.5 IM SUSP: 0.5000 mL | Freq: Once | INTRAMUSCULAR | Status: DC

## 2019-12-31 MED ORDER — WITCH HAZEL-GLYCERIN EX PADS: 1.0000 "application " | MEDICATED_PAD | CUTANEOUS | Status: DC | PRN

## 2019-12-31 MED ORDER — DIPHENHYDRAMINE HCL 25 MG PO CAPS: 25.0000 mg | ORAL_CAPSULE | Freq: Four times a day (QID) | ORAL | Status: DC | PRN

## 2019-12-31 MED ORDER — LACTATED RINGERS IV SOLN
500.0000 mL | Freq: Once | INTRAVENOUS | Status: AC
  Administered 2019-12-31: 500 mL via INTRAVENOUS

## 2019-12-31 MED ORDER — EPHEDRINE 5 MG/ML INJ: 10.0000 mg | INTRAVENOUS | Status: DC | PRN

## 2019-12-31 MED ORDER — ACETAMINOPHEN 325 MG PO TABS: 650.0000 mg | ORAL_TABLET | ORAL | Status: DC | PRN

## 2019-12-31 MED ORDER — ONDANSETRON HCL 4 MG PO TABS: 4.0000 mg | ORAL_TABLET | ORAL | Status: DC | PRN

## 2019-12-31 MED ORDER — IBUPROFEN 600 MG PO TABS
600.0000 mg | ORAL_TABLET | Freq: Four times a day (QID) | ORAL | Status: DC
  Administered 2019-12-31 – 2020-01-01 (×7): 600 mg via ORAL
  Filled 2019-12-31 (×9): qty 1

## 2019-12-31 MED ORDER — SENNOSIDES-DOCUSATE SODIUM 8.6-50 MG PO TABS
2.0000 | ORAL_TABLET | ORAL | Status: DC
  Administered 2019-12-31 – 2020-01-01 (×2): 2 via ORAL
  Filled 2019-12-31 (×2): qty 2

## 2019-12-31 MED ORDER — COCONUT OIL OIL: 1.0000 "application " | TOPICAL_OIL | Status: DC | PRN

## 2019-12-31 MED ORDER — DIPHENHYDRAMINE HCL 50 MG/ML IJ SOLN: 12.5000 mg | INTRAMUSCULAR | Status: DC | PRN

## 2019-12-31 MED ORDER — FENTANYL-BUPIVACAINE-NACL 0.5-0.125-0.9 MG/250ML-% EP SOLN
12.0000 mL/h | EPIDURAL | Status: DC | PRN
  Filled 2019-12-31: qty 250

## 2019-12-31 MED ORDER — DIBUCAINE (PERIANAL) 1 % EX OINT: 1.0000 "application " | TOPICAL_OINTMENT | CUTANEOUS | Status: DC | PRN

## 2019-12-31 MED ORDER — BENZOCAINE-MENTHOL 20-0.5 % EX AERO: 1.0000 "application " | INHALATION_SPRAY | CUTANEOUS | Status: DC | PRN

## 2019-12-31 NOTE — Progress Notes (Signed)
Patient Vitals for the past 4 hrs:  BP Temp Temp src Pulse Resp  12/31/19 0400 (!) 150/93 -- -- 86 16  12/31/19 0330 116/60 -- -- 77 16  12/31/19 0304 (!) 142/80 -- -- 83 --  12/31/19 0302 (!) 145/100 -- -- 86 16  12/31/19 0245 -- 99.2 F (37.3 C) Oral -- --  12/31/19 0230 136/82 -- -- 81 16  12/31/19 0200 140/81 -- -- 84 16  12/31/19 0130 130/79 -- -- 81 16  12/31/19 0102 123/72 -- -- 86 16   Pt laboring on 20mu/min of Pitocin. Cx now 5-6/80/-2.  FHR Cat 1, ctx q 1-3 mintues  Trying to avoid epidural, so will hold off on AROM.

## 2019-12-31 NOTE — Progress Notes (Signed)
Taylor Cole is a 29 y.o. G3P1011 at [redacted]w[redacted]d by LMP =US at 13+4 admitted for induction of labor due to elevated BP 2/2 gHTN with no concerning PreE labs.  Subjective: Having more discomfort. Doing well on birthing ball.   Objective: BP 129/88   Pulse 89   Temp 97.9 F (36.6 C) (Oral)   Resp 18   Ht 5' (1.524 m)   Wt 78.2 kg   LMP 04/07/2019 (Exact Date)   BMI 33.67 kg/m   Patient Vitals for the past 24 hrs:  BP Temp Temp src Pulse Resp Height Weight  12/31/19 0530 129/88 - - 89 - - -  12/31/19 0525 - 97.9 F (36.6 C) Oral - 18 - -  12/31/19 0500 126/65 - - 80 - - -  12/31/19 0445 136/77 - - 82 16 - -  12/31/19 0400 (!) 150/93 - - 86 16 - -  12/31/19 0330 116/60 - - 77 16 - -  12/31/19 0304 (!) 142/80 - - 83 - - -  12/31/19 0302 (!) 145/100 - - 86 16 - -  12/31/19 0245 - 99.2 F (37.3 C) Oral - - - -  12/31/19 0230 136/82 - - 81 16 - -  12/31/19 0200 140/81 - - 84 16 - -  12/31/19 0130 130/79 - - 81 16 - -   FHT:  FHR: 140 bpm, variability: moderate,  accelerations:  Present,  decelerations:  Absent UC:   irregular, every 2-4 minutes SVE:   Dilation: 7.5 Effacement (%): 80, 90 Station: -1 Exam by:: Dr. Selena Batten  Labs: Lab Results  Component Value Date   WBC 5.8 12/30/2019   HGB 10.4 (L) 12/30/2019   HCT 35.3 (L) 12/30/2019   MCV 75.6 (L) 12/30/2019   PLT 211 12/30/2019   Assessment / Plan: IOL  due to elevated BP 2/2 gHTN with no concerning PreE labs.  Labor: cytotec x 1, pitocin @ 1140 and has been contracting well at 67mu/min. AROM with clear fluid. Baby's head engaged further and post AROM CE was 7.5/80;90/-1.  GHTN: BP ranges as above. No antihypertensives on board at this time.  Fetal Wellbeing:  Category I.  Pain Control:  per patient request I/D:  n/a Anticipated MOD:  VD  Melene Plan 12/31/2019, 6:07 AM

## 2019-12-31 NOTE — Lactation Note (Addendum)
This note was copied from a baby's chart. Lactation Consultation Note  Patient Name: Taylor Cole [id number] Date: 12/31/2019 Reason for consult: Initial assessment;Early term 69-38.6wks  P2 mother whose infant is now 61 hours old.  This is an ETI at 38+2 weeks.  Mother exclusively breast fed her first child for 6 months and then continued to breast feed for another year.  Baby was swaddled and asleep in the bassinet when I arrived.  RN had attempted to awaken and assist with breast feeding, however, baby was too sleepy.  Encouraged mother to feed 8-12 times/24 hours or sooner if baby shows feeding cues.  She is familiar with hand expression and has seen a couple drops of colostrum.  Container provided and milk storage times reviewed.  Finger feeding demonstrated.  Encouraged mother to call her RN/LC for latch assistance as needed.  Mom made aware of O/P services, breastfeeding support groups, community resources, and our phone # for post-discharge questions.   Mother does not have a DEBP for home use.  She does not have private insurance or [facility] but has Medicaid.  I suggested she call the [facility] office about applying for assistance if desired.  She would like to get a DEBP if she is eligible.  Father present.   Maternal Data Formula Feeding for Exclusion: No Has patient been taught Hand Expression?: Yes Does the patient have breastfeeding experience prior to this delivery?: Yes  Feeding Feeding Type: Breast Fed  LATCH Score Latch: Too sleepy or reluctant, no latch achieved, no sucking elicited.                 Interventions    Lactation Tools Discussed/Used WIC Program: No(Suggested mother apply if she desires a DEBP)   Consult Status Consult Status: Follow-up Date: 01/01/20 Follow-up type: In-patient    Kajuan Guyton R Madi Bonfiglio 12/31/2019, 4:01 PM

## 2019-12-31 NOTE — Discharge Summary (Addendum)
Postpartum Discharge Summary     Patient Name: Taylor Cole DOB: 28-Aug-1991 MRN: [id number]  Date of admission: 12/30/2019 Delivering Provider: Marcille Buffy D   Date of discharge: 01/02/2020  Admitting diagnosis: Gestational hypertension [O13.9] Intrauterine pregnancy: [redacted]w[redacted]d    Secondary diagnosis:  Active Problems:   Gestational hypertension  Additional problems: none     Discharge diagnosis: Term Pregnancy Delivered and Gestational Hypertension                                                                                                Post partum procedures: none  Augmentation: AROM, Pitocin and Cytotec  Complications: None  Hospital course:  Induction of Labor With Vaginal Delivery   29y.o. yo G3P1011 at 389w2das admitted to the hospital 12/30/2019 for induction of labor.  Indication for induction:  GHTN .  Patient had an uncomplicated labor course as follows: Membrane Rupture Time/Date: 5:53 AM ,12/31/2019   Intrapartum Procedures: Episiotomy: None [1]                                         Lacerations:    Patient had delivery of a Viable infant.  Information for the patient's newborn:  WoFlynn, Lininger0[[id number]]Delivery Method: Vaginal, Spontaneous(Filed from Delivery Summary)    12/31/2019  Details of delivery can be found in separate delivery note.  Patient had a routine postpartum course. Patient is discharged home 01/02/20. Delivery time: 8:35 AM    Magnesium Sulfate received: No BMZ received: No Rhophylac:N/A MMR:N/A Transfusion:No  Physical exam  Vitals:   01/01/20 0941 01/01/20 1518 01/01/20 2020 01/02/20 0545  BP: 136/82 133/84 (!) 141/81 130/84  Pulse: 89 79 88 100  Resp: '18 18 18 18  '$ Temp: 98 F (36.7 C) 98.9 F (37.2 C) 98.4 F (36.9 C) 97.8 F (36.6 C)  TempSrc: Oral Oral Oral Oral  SpO2:   100% 100%  Weight:      Height:       General: alert, cooperative and no distress Lochia: appropriate Uterine Fundus: firm Incision:  N/A DVT Evaluation: No evidence of DVT seen on physical exam. Labs: Lab Results  Component Value Date   WBC 8.2 01/01/2020   HGB 9.7 (L) 01/01/2020   HCT 32.2 (L) 01/01/2020   MCV 75.1 (L) 01/01/2020   PLT 213 01/01/2020   CMP Latest Ref Rng & Units 01/01/2020  Glucose 70 - 99 mg/dL 89  BUN 6 - 20 mg/dL 5(L)  Creatinine 0.44 - 1.00 mg/dL 0.47  Sodium 135 - 145 mmol/L 141  Potassium 3.5 - 5.1 mmol/L 3.6  Chloride 98 - 111 mmol/L 108  CO2 22 - 32 mmol/L 25  Calcium 8.9 - 10.3 mg/dL 9.5  Total Protein 6.5 - 8.1 g/dL 5.8(L)  Total Bilirubin 0.3 - 1.2 mg/dL 0.6  Alkaline Phos 38 - 126 U/L 83  AST 15 - 41 U/L 26  ALT 0 - 44 U/L 13    Discharge instruction: per After Visit Summary and "Baby and  Me Booklet".  After visit meds:  Allergies as of 01/02/2020       Reactions   Feraheme [ferumoxytol] Anaphylaxis   Throat tightening, shortness of breath, chest pain, nausea, severe back pain        Medication List     TAKE these medications    acetaminophen 325 MG tablet Commonly known as: Tylenol Take 2 tablets (650 mg total) by mouth every 4 (four) hours as needed (for pain scale < 4).   Blood Pressure Kit 1 kit by Does not apply route once a week. Check BP Weekly.  Large Cuff  DX: Z13.6        Z34.86   butalbital-acetaminophen-caffeine 50-325-40 MG tablet Commonly known as: FIORICET Take 1-2 tablets by mouth every 6 (six) hours as needed for headache.   Doxylamine-Pyridoxine 10-10 MG Tbec Commonly known as: Diclegis Take 2 tablets by mouth at bedtime. If symptoms persist, add one tablet in the morning and one in the afternoon   ferrous sulfate 325 (65 FE) MG tablet Take 1 tablet (325 mg total) by mouth 2 (two) times daily with a meal.   ibuprofen 600 MG tablet Commonly known as: ADVIL Take 1 tablet (600 mg total) by mouth every 6 (six) hours as needed for mild pain or moderate pain.   NIFEdipine 30 MG 24 hr tablet Commonly known as: ADALAT CC Take 1 tablet (30  mg total) by mouth daily.   PrePLUS 27-1 MG Tabs Take 1 tablet by mouth daily.        Diet: routine diet  Activity: Advance as tolerated. Pelvic rest for 6 weeks.   Outpatient follow up:4 weeks Follow up Appt: Future Appointments  Date Time Provider [place]  01/29/2020 10:15 AM Darlina Rumpf, CNM [facility] None   Follow up Visit:    Please schedule this patient for Postpartum visit in: 4 weeks with the following provider: Any provider For C/S patients schedule nurse incision check in weeks 2 weeks: no High risk pregnancy complicated by: HTN Delivery mode:  SVD Anticipated Birth Control:  other/unsure PP Procedures needed: BP check for around one week after discharge. Appears that 01/06/20 appt was made but cancelled. Hansel Feinstein will re-message [facility] today to make sure patient gets seen in one week. D/c'ed with 30 mg Nifedipine XL daily.  Schedule Integrated BH visit: no  Newborn Data: Live born female  Birth Weight:  35 APGAR: 63, 9  Newborn Delivery   Birth date/time: 12/31/2019 08:35:00 Delivery type: Vaginal, Spontaneous     Baby Feeding: Breast Disposition:home with mother   Wilber Oliphant, M.D.  6:13 AM 01/02/2020  Attestation:  I confirm that I have verified the information documented in the resident's note and that I have also personally reperformed the physical exam and all medical decision making activities.  Patient was seen and examined by me also Agree with note Vitals stable Labs stable Fundus firm, lochia within normal limits Perineum healing Ext WNL Ready for discharge  Seabron Spates, CNM

## 2020-01-01 LAB — PROTEIN / CREATININE RATIO, URINE
Creatinine, Urine: 33.37 mg/dL
Total Protein, Urine: <6 mg/dL

## 2020-01-01 LAB — CBC
HCT: 32.2 % — ABNORMAL LOW (ref 36.0–46.0)
Hemoglobin: 9.7 g/dL — ABNORMAL LOW (ref 12.0–15.0)
MCH: 22.6 pg — ABNORMAL LOW (ref 26.0–34.0)
MCHC: 30.1 g/dL (ref 30.0–36.0)
MCV: 75.1 fL — ABNORMAL LOW (ref 80.0–100.0)
Platelets: 213 10*3/uL (ref 150–400)
RBC: 4.29 MIL/uL (ref 3.87–5.11)
RDW: 22.7 % — ABNORMAL HIGH (ref 11.5–15.5)
WBC: 8.2 10*3/uL (ref 4.0–10.5)
nRBC: 0 % (ref 0.0–0.2)

## 2020-01-01 LAB — COMPREHENSIVE METABOLIC PANEL
ALT: 13 U/L (ref 0–44)
AST: 26 U/L (ref 15–41)
Albumin: 2.5 g/dL — ABNORMAL LOW (ref 3.5–5.0)
Alkaline Phosphatase: 83 U/L (ref 38–126)
Anion gap: 8 (ref 5–15)
BUN: 5 mg/dL — ABNORMAL LOW (ref 6–20)
CO2: 25 mmol/L (ref 22–32)
Calcium: 9.5 mg/dL (ref 8.9–10.3)
Chloride: 108 mmol/L (ref 98–111)
Creatinine, Ser: 0.47 mg/dL (ref 0.44–1.00)
GFR calc Af Amer: >60 mL/min (ref >60)
GFR calc non Af Amer: >60 mL/min (ref >60)
Glucose, Bld: 89 mg/dL (ref 70–99)
Potassium: 3.6 mmol/L (ref 3.5–5.1)
Sodium: 141 mmol/L (ref 135–145)
Total Bilirubin: 0.6 mg/dL (ref 0.3–1.2)
Total Protein: 5.8 g/dL — ABNORMAL LOW (ref 6.5–8.1)

## 2020-01-01 MED ORDER — MEASLES, MUMPS & RUBELLA VAC IJ SOLR
0.5000 mL | Freq: Once | INTRAMUSCULAR | Status: AC
  Administered 2020-01-02: 0.5 mL via SUBCUTANEOUS
  Filled 2020-01-01: qty 0.5

## 2020-01-01 MED ORDER — ACETAMINOPHEN 325 MG PO TABS: 650.0000 mg | ORAL_TABLET | ORAL | Status: AC | PRN

## 2020-01-01 MED ORDER — NIFEDIPINE ER OSMOTIC RELEASE 30 MG PO TB24
30.0000 mg | ORAL_TABLET | Freq: Every day | ORAL | Status: DC
  Administered 2020-01-01 – 2020-01-02 (×2): 30 mg via ORAL
  Filled 2020-01-01 (×2): qty 1

## 2020-01-01 MED ORDER — IBUPROFEN 600 MG PO TABS: 600.0000 mg | ORAL_TABLET | Freq: Four times a day (QID) | ORAL | 0 refills | Status: AC | PRN

## 2020-01-01 MED ORDER — IBUPROFEN 600 MG PO TABS: 600.0000 mg | ORAL_TABLET | Freq: Four times a day (QID) | ORAL | 0 refills | Status: DC

## 2020-01-01 NOTE — Progress Notes (Signed)
Post Partum Day 1 Subjective: no complaints, up ad lib, voiding, tolerating PO, + flatus and no headaches, visual disturbances, n/v, RUQ pain  Objective: Blood pressure (!) 141/88, pulse 72, temperature 97.6 F (36.4 C), temperature source Oral, resp. rate 18, height 5' (1.524 m), weight 78.2 kg, last menstrual period 04/07/2019, SpO2 100 %, unknown if currently breastfeeding.  Physical Exam:  General: alert, cooperative and no distress Lochia: appropriate Uterine Fundus: firm DVT Evaluation: No evidence of DVT seen on physical exam.  Recent Labs    12/30/19 1746 12/31/19 0739  HGB 10.4* 11.2*  HCT 35.3* 37.4   Assessment/Plan: Plan for discharge tomorrow   GHTN: elevated BP's overnight. Some severe range; however, improved at recheck. Will start nifedipine once daily and monitor today. She denies headaches, visual changes, n/v, RUQ pain. Continue to monitor today. Titrate nifedipine PRN.   Declines contraception   LOS: 2 days   Melene Plan 01/01/2020, 7:57 AM

## 2020-01-02 MED ORDER — NIFEDIPINE ER 30 MG PO TB24: 30.0000 mg | ORAL_TABLET | Freq: Every day | ORAL | 0 refills | Status: AC

## 2020-01-02 MED FILL — NIFEdipine ER 30 MG TB24: 30 | 30 days supply | Qty: 30 | Fill #0

## 2020-01-02 NOTE — Progress Notes (Signed)
B/P 145/101 but otherwise asymptotic before scheduled dose of Procardia given at 1010. Notified Dorathy Kinsman, CNM. Patient will be discharged to home and doesn't need to stay for a recheck..Patient informed to take B/P at 0830 tomorrow and call the office with the reading.

## 2020-01-02 NOTE — Lactation Note (Signed)
This note was copied from a baby's chart. Lactation Consultation Note Experienced BF mom BF for 1 1/2 yrs denies pain. Mom asked how long to BF before she switches to breast. Mom states BF well. Has no questions and doesn't need any assistance.  Encouraged to call if needed.  Patient Name: Taylor Cole BEEFE'O Date: 01/02/2020 Reason for consult: Follow-up assessment;Early term 37-38.6wks   Maternal Data    Feeding    LATCH Score                   Interventions Interventions: Breast feeding basics reviewed;Support pillows;Breast massage;Hand express;Adjust position;Breast compression  Lactation Tools Discussed/Used     Consult Status Consult Status: Follow-up Date: 01/02/20 Follow-up type: In-patient    Ramona Ruark, Diamond Nickel 01/02/2020, 3:00 AM

## 2020-01-03 ENCOUNTER — Telehealth (INDEPENDENT_AMBULATORY_CARE_PROVIDER_SITE_OTHER): Payer: Medicaid Other

## 2020-01-03 VITALS — BP 140/90 | HR 98

## 2020-01-03 DIAGNOSIS — O133 Gestational [pregnancy-induced] hypertension without significant proteinuria, third trimester: Secondary | ICD-10-CM

## 2020-01-03 NOTE — Progress Notes (Signed)
I connected with  Taylor Cole on 01/03/20 by a video enabled telemedicine application and verified that I am speaking with the correct person using two identifiers.   I discussed the limitations of evaluation and management by telemedicine. The patient expressed understanding and agreed to proceed.   PP BP Check.  Subjective:  Taylor Cole is a 29 y.o. female here for BP check.   Hypertension ROS: taking medications as instructed, no medication side effects noted, no TIA's, no chest pain on exertion, no dyspnea on exertion and no swelling of ankles.    Objective:  BP 140/90   Pulse 98   Appearance alert, well appearing, and in no distress and oriented to person, place, and time. General exam BP noted to be well controlled today in office.    Assessment:   Blood Pressure stable.   Plan:  Current treatment plan is effective, no change in therapy. Return in 1 week for BP check. Keep PP appointment per Dr. Clearance Coots.

## 2020-01-06 ENCOUNTER — Telehealth (INDEPENDENT_AMBULATORY_CARE_PROVIDER_SITE_OTHER): Payer: Medicaid Other

## 2020-01-06 ENCOUNTER — Ambulatory Visit: Payer: Medicaid Other

## 2020-01-06 ENCOUNTER — Encounter: Payer: Medicaid Other | Admitting: Advanced Practice Midwife

## 2020-01-06 VITALS — BP 149/90 | HR 105

## 2020-01-06 DIAGNOSIS — Z0130 Encounter for examination of blood pressure without abnormal findings: Secondary | ICD-10-CM

## 2020-01-06 NOTE — Progress Notes (Signed)
Virtual Visit via Telephone Note  I connected with Taylor Cole on 01/06/20 at  9:30 AM EST by telephone and verified that I am speaking with the correct person using two identifiers.  Location: Patient: Home   Assessment and Plan  Pt reports first bp as 131/92.  I had pt rest for about 5 minute sit with feet flat on the floor and elevate her arm to heart level.  After resting her bp was 149/90.  Advised pt that this will be routed to provider and that she will be contacted with further recommendations.      I provided 15 minutes of non-face-to-face time during this encounter.   Thom Chimes, CMA

## 2020-01-29 ENCOUNTER — Ambulatory Visit: Payer: Medicaid Other | Admitting: Advanced Practice Midwife

## 2020-02-07 ENCOUNTER — Ambulatory Visit (INDEPENDENT_AMBULATORY_CARE_PROVIDER_SITE_OTHER): Payer: Medicaid Other | Admitting: Medical

## 2020-02-07 ENCOUNTER — Encounter: Payer: Self-pay | Admitting: Medical

## 2020-02-07 ENCOUNTER — Other Ambulatory Visit: Payer: Self-pay

## 2020-02-07 DIAGNOSIS — Z8759 Personal history of other complications of pregnancy, childbirth and the puerperium: Secondary | ICD-10-CM | POA: Insufficient documentation

## 2020-02-07 NOTE — Addendum Note (Signed)
Addended by: Marny Lowenstein on: 02/07/2020 11:14 AM   Modules accepted: Kipp Brood

## 2020-02-07 NOTE — Patient Instructions (Signed)
Natural Family Planning  Natural Family Planning (NFP) is a type of birth control (contraception) in which no form of contraceptive medicine or device is used. The NFP method relies on knowing which days of the month a woman's ovary is producing an egg (ovulation). Ovulation is the time in the menstrual cycle when a woman is most fertile and, therefore, most likely to become pregnant. To lower the chance of pregnancy, sex is avoided during ovulation. NFP is a safe method of birth control and can prevent pregnancy if it is done correctly. However, NFP does not provide protection from sexually transmitted diseases. NFP can also be used as a method of getting pregnant, by deciding to have sex during ovulation. How does the NFP method work? NFP works by making both sexual partners aware of how the woman's body functions during her menstrual cycle.  Usually, a woman has a menstrual period every 28-30 days. However, there can be 23-35 days between each menstrual period. This varies for each woman. A woman with a 28-day menstrual cycle has about 6 days a month when she is most likely to get pregnant.  Ovulation happens 12-14 days before the start of the next menstrual period. There are different methods that are used to determine when ovulation starts.  An egg is fertile for 24 hours after it is released from the ovary. Sperm can live for 3 days or more. What NFP methods can be used to prevent pregnancy? The basal body temperature method During ovulation, there is often a slight increase in body temperature. To use this method:  Take your temperature every morning before getting out of bed. Write the temperature on a chart.  Do not have sex from the day the menstrual periods starts until 3 days after the increase in temperature. Note that body temperature may increase as a result of various factors, including fever, restless sleep, and working schedules. The cervical mucus method Right before  ovulation, mucus from the lower part of the uterus (cervix) changes from dry and sticky to wet and slippery. To use this method:  Check the mucus every day to look for these changes. Ovulation happens on the last day of wet, slippery mucus.  Do not have sex starting when you first see wet, slippery mucus and until 4 days after it stops or returns to its normal consistency.  With this method, it is safe to have sex: ? After the 4 days have passed, and until 10 days after the menstrual period starts. ? On days when mucus is dry. Note that cervical mucus can increase or change consistency due to reasons other than ovulation, such as infection, lubricants, some medicines, and sexual arousal. Other variations of the cervical mucus method include the TwoDay method, [place] ovulation method, and the [employer]. The symptothermal method This method combines the basal body temperature and the cervical mucus methods. The calendar method This method involves tracking menstrual cycles to determine when ovulation occurs. This method is helpful when the menstrual cycle varies in length. To use this method:  For 6 months, record when menstrual periods start and end and the length of each menstrual cycle. The length of a menstrual cycle is from day 1 of the present menstrual period to day 1 of the next menstrual period.  Use this information to determine when you will likely ovulate. Avoid sex during that time. You may need help from your health care provider to determine which days you are most likely to get pregnant. ?  Ovulation usually happens 12-14 days before the start of the next menstrual period. ? Light vaginal bleeding (spotting) or abdominal cramps during the middle of a menstrual cycle may be signs of ovulation. However, not all women have these symptoms. The standard days method This method is based on studies of women's hormone levels throughout normal menstrual cycles. Based on these  studies, a standard rule was developed to predict when women are most fertile during their menstrual cycle. According to the rule, if your cycle is 26-32 days long, you are most fertile between days 8 and 19. To prevent pregnancy, you should avoid having sex during this time, or use a barrier method of birth control. This method works best if your cycle is regularly between 26-32 days long. When should I not use the NFP method? You should not use NFP if:  You have very irregular menstrual periods or you sometimes skip a menstrual period.  You have abnormal vaginal bleeding.  You recently had a baby or are breastfeeding.  You have a vaginal or cervical infection.  You take medicines that can affect vaginal mucus or body temperature. These medicines include antibiotics, thyroid medicines, and antihistamines that are found in some cold and allergy medicines.  You absolutely do not want to become pregnant at the current time. Other methods of contraception are more effective at preventing pregnancy than NFP.  You are concerned about sexually transmitted diseases. Summary  Natural Family Planning methods help women and their partners to understand how to avoid pregnancy without using medicines or other methods.  A woman learns to recognize her most fertile days. A woman with a 28 day menstrual cycle has about 6 days per month when she can get pregnant. This information is not intended to replace advice given to you by your health care provider. Make sure you discuss any questions you have with your health care provider. Document Revised: 03/29/2019 Document Reviewed: 01/23/2017 Elsevier Patient Education  2020 Elsevier Inc.  

## 2020-02-07 NOTE — Progress Notes (Signed)
Post Partum Exam  Taylor Cole is a 29 y.o. G10P2012 female who presents for a postpartum visit. She is 5 weeks postpartum following a spontaneous vaginal delivery. I have fully reviewed the prenatal and intrapartum course. The delivery was at 38 gestational weeks.  Anesthesia: none. Postpartum course has been uncomplicated. Baby's course has been uncomplicated. Baby is feeding by breast. Bleeding staining only. Bowel function is normal. Bladder function is normal. Patient is not sexually active. Contraception method is abstinence. Postpartum depression screening:neg  The following portions of the patient's history were reviewed and updated as appropriate: allergies, current medications, past family history, past medical history, past social history, past surgical history and problem list. Last pap smear done 11/2018 and was Normal  Review of Systems Pertinent items are noted in HPI.    Objective:  Blood pressure 132/88, pulse 91, height 5\' 1"  (1.549 m), weight 142 lb 8 oz (64.6 kg), last menstrual period 02/01/2020, currently breastfeeding.  General:  alert and cooperative   Breasts:  not performed  Lungs: clear to auscultation bilaterally  Heart:  regular rate and rhythm, S1, S2 normal, no murmur, click, rub or gallop  Abdomen: soft, non-tender; bowel sounds normal; no masses,  no organomegaly   Vulva:  not evaluated  Vagina: not evaluated  Cervix:  not evaluated  Corpus: not examined  Adnexa:  not evaluated  Rectal Exam: Not performed.        Assessment:    Normal postpartum exam. Pap smear not done at today's visit.  H/O GHTN  Plan:   1. Contraception: NFP 2. Discussed with Dr. 02/03/2020. BP check in one week to confirm no need for anti-hypertensives  3. Follow up in: 1 week for virtual BP check or sooner as needed.   Shawnie Pons, PA-C 02/07/2020 10:39 AM

## 2020-02-17 ENCOUNTER — Telehealth (INDEPENDENT_AMBULATORY_CARE_PROVIDER_SITE_OTHER): Payer: Medicaid Other | Admitting: *Deleted

## 2020-02-17 VITALS — BP 121/82 | HR 73

## 2020-02-17 DIAGNOSIS — Z0130 Encounter for examination of blood pressure without abnormal findings: Secondary | ICD-10-CM

## 2020-02-17 DIAGNOSIS — Z8759 Personal history of other complications of pregnancy, childbirth and the puerperium: Secondary | ICD-10-CM

## 2020-02-17 NOTE — Progress Notes (Signed)
I connected with  Taylor Cole on 02/17/20 by a video enabled telemedicine application and verified that I am speaking with the correct person using two identifiers.   I discussed the limitations of evaluation and management by telemedicine. The patient expressed understanding and agreed to proceed.    Subjective:  Taylor Cole is a 29 y.o. female televisit for BP check.   Hypertension ROS: taking medications as instructed, no medication side effects noted, no TIA's, no chest pain on exertion, no dyspnea on exertion and no swelling of ankles.    Objective:  LMP 02/01/2020   BP 121/82   Pulse 73   LMP 02/01/2020   Appearance alert and in no distress. General exam BP noted to be well controlled today in office.    Assessment:   Blood Pressure stable.   Plan:  Current treatment plan is effective, no change in therapy.  Advised she needs to find PCP for medical management.

## 2020-02-18 NOTE — Progress Notes (Signed)
Patient seen and assessed by nursing staff during this encounter. I have reviewed the chart and agree with the documentation and plan.  Catalina Antigua, MD 02/18/2020 3:54 PM

## 2020-03-27 ENCOUNTER — Ambulatory Visit (HOSPITAL_COMMUNITY)
Admission: EM | Admit: 2020-03-27 | Discharge: 2020-03-27 | Disposition: A | Discharge status: Home / Self Care | Payer: Medicaid Other | Attending: Family Medicine | Admitting: Family Medicine

## 2020-03-27 ENCOUNTER — Other Ambulatory Visit: Payer: Self-pay

## 2020-03-27 ENCOUNTER — Encounter (HOSPITAL_COMMUNITY): Payer: Self-pay

## 2020-03-27 ENCOUNTER — Ambulatory Visit (INDEPENDENT_AMBULATORY_CARE_PROVIDER_SITE_OTHER): Payer: Medicaid Other

## 2020-03-27 DIAGNOSIS — M25562 Pain in left knee: Secondary | ICD-10-CM | POA: Diagnosis not present

## 2020-03-27 HISTORY — DX: Essential (primary) hypertension: I10

## 2020-03-27 HISTORY — DX: Gestational (pregnancy-induced) hypertension without significant proteinuria, unspecified trimester: O13.9

## 2020-03-27 NOTE — Discharge Instructions (Signed)
Your x ray was normal Knee brace given here. Rest, ice elevate the knee and follow up with ortho

## 2020-03-27 NOTE — ED Triage Notes (Signed)
Pt c/o chronic bilat knee pain, but states that recently both knees are "locking up" and she cannot bend or straighten them easily and has more pain with when "locking up".  Denies numbness to bilat feet/toes. Pt states she was previously dx with "arthritis" in 2015.

## 2020-03-29 NOTE — ED Provider Notes (Signed)
[facility]    CSN: [id number] Arrival date & time: 03/27/20  5997      History   Chief Complaint Chief Complaint  Patient presents with  . Knee Pain    HPI Taylor Cole is a 29 y.o. female.   Patient is a 29 year old female who presents today for chronic, bilateral knee pain.  Reporting this episode has flared over the past couple days.  History of arthritis in both knees.  Describing sensation of "locking up" and sometimes she cannot bend or straighten it is easy.  No injuries to the knees or falls.  No swelling, erythema, numbness or tingling.  Has not taken anything for her symptoms.  ROS per HPI      Past Medical History:  Diagnosis Date  . Gestational hypertension   . Hypertension   . Medical history non-contributory     Patient Active Problem List   Diagnosis Date Noted  . History of gestational hypertension 02/07/2020  . Trisomy 21 fetus in previous pregnancy, currently pregnant 06/24/2019  . SMA carrier status 12/20/2018    Past Surgical History:  Procedure Laterality Date  . DILATION AND CURETTAGE OF UTERUS      OB History    Gravida  3   Para  2   Term  2   Preterm      AB  1   Living  2     SAB      TAB  1   Ectopic      Multiple  0   Live Births  2            Home Medications    Prior to Admission medications   Medication Sig Start Date End Date Taking? Authorizing Provider  acetaminophen (TYLENOL) 325 MG tablet Take 2 tablets (650 mg total) by mouth every 4 (four) hours as needed (for pain scale < 4). Patient not taking: Reported on 02/07/2020 01/01/20   Wilber Oliphant, MD  Blood Pressure KIT 1 kit by Does not apply route once a week. Check BP Weekly.  Large Cuff  DX: Z13.6        Z34.86 06/24/19   Anyanwu, Sallyanne Havers, MD  butalbital-acetaminophen-caffeine (FIORICET) 50-325-40 MG tablet Take 1-2 tablets by mouth every 6 (six) hours as needed for headache. 07/22/19 07/21/20  Leftwich-Kirby, Kathie Dike, CNM  ferrous  sulfate 325 (65 FE) MG tablet Take 1 tablet (325 mg total) by mouth 2 (two) times daily with a meal. 11/11/19   Woodroe Mode, MD  ibuprofen (ADVIL) 600 MG tablet Take 1 tablet (600 mg total) by mouth every 6 (six) hours as needed for mild pain or moderate pain. Patient not taking: Reported on 02/07/2020 01/01/20   Wilber Oliphant, MD  NIFEdipine (ADALAT CC) 30 MG 24 hr tablet Take 1 tablet (30 mg total) by mouth daily. Patient not taking: Reported on 02/07/2020 01/02/20   Wilber Oliphant, MD  Prenatal Vit-Fe Fumarate-FA (PREPLUS) 27-1 MG TABS Take 1 tablet by mouth daily. 11/22/18   Lajean Manes, CNM    Family History Family History  Problem Relation Age of Onset  . Arthritis Mother   . Hypertension Father   . Stroke Father     Social History Social History   Tobacco Use  . Smoking status: Never Smoker  . Smokeless tobacco: Never Used  Substance Use Topics  . Alcohol use: Never  . Drug use: Never     Allergies   Feraheme [ferumoxytol]  Review of Systems Review of Systems   Physical Exam Triage Vital Signs ED Triage Vitals  Enc Vitals Group     BP 03/27/20 0946 124/88     Pulse Rate 03/27/20 0946 80     Resp 03/27/20 0946 18     Temp 03/27/20 0946 97.9 F (36.6 C)     Temp Source 03/27/20 0946 Oral     SpO2 03/27/20 0946 98 %     Weight --      Height --      Head Circumference --      Peak Flow --      Pain Score 03/27/20 0943 0     Pain Loc --      Pain Edu? --      Excl. in [place]? --    No data found.  Updated Vital Signs BP 124/88 (BP Location: Right Arm)   Pulse 80   Temp 97.9 F (36.6 C) (Oral)   Resp 18   LMP 02/23/2020 (Exact Date)   SpO2 98%   Visual Acuity Right Eye Distance:   Left Eye Distance:   Bilateral Distance:    Right Eye Near:   Left Eye Near:    Bilateral Near:     Physical Exam Vitals and nursing note reviewed.  Constitutional:      General: She is not in acute distress.    Appearance: Normal appearance. She is not  ill-appearing, toxic-appearing or diaphoretic.  HENT:     Head: Normocephalic.     Nose: Nose normal.  Eyes:     Conjunctiva/sclera: Conjunctivae normal.  Pulmonary:     Effort: Pulmonary effort is normal.  Musculoskeletal:        General: No swelling or signs of injury. Normal range of motion.     Cervical back: Normal range of motion.     Right knee: Normal.     Left knee: Bony tenderness present. Tenderness present over the medial joint line and lateral joint line. No LCL laxity, MCL laxity, ACL laxity or PCL laxity. Skin:    General: Skin is warm and dry.     Findings: No rash.  Neurological:     Mental Status: She is alert.  Psychiatric:        Mood and Affect: Mood normal.      UC Treatments / Results  Labs (all labs ordered are listed, but only abnormal results are displayed) Labs Reviewed - No data to display  EKG   Radiology No results found.  Procedures Procedures (including critical care time)  Medications Ordered in UC Medications - No data to display  Initial Impression / Assessment and Plan / UC Course  I have reviewed the triage vital signs and the nursing notes.  Pertinent labs & imaging results that were available during my care of the patient were reviewed by me and considered in my medical decision making (see chart for details).     Knee pain-x-ray without any acute abnormalities.  Treating with rest, ice, elevate and knee brace given here for support Contact given for sports medicine follow up as needed.   Final Clinical Impressions(s) / UC Diagnoses   Final diagnoses:  Acute pain of left knee     Discharge Instructions     Your x ray was normal Knee brace given here. Rest, ice elevate the knee and follow up with ortho    ED Prescriptions    None     PDMP not reviewed this encounter.  Loura Halt A, NP 03/29/20 1045

## 2020-04-08 ENCOUNTER — Ambulatory Visit (INDEPENDENT_AMBULATORY_CARE_PROVIDER_SITE_OTHER): Payer: Medicaid Other | Admitting: Sports Medicine

## 2020-04-08 ENCOUNTER — Other Ambulatory Visit: Payer: Self-pay

## 2020-04-08 ENCOUNTER — Encounter: Payer: Self-pay | Admitting: Sports Medicine

## 2020-04-08 VITALS — BP 106/80 | Ht 60.0 in | Wt 130.0 lb

## 2020-04-08 DIAGNOSIS — M222X1 Patellofemoral disorders, right knee: Secondary | ICD-10-CM

## 2020-04-08 DIAGNOSIS — M222X2 Patellofemoral disorders, left knee: Secondary | ICD-10-CM | POA: Diagnosis not present

## 2020-04-08 NOTE — Progress Notes (Addendum)
PCP: Patient, No Pcp Per  Subjective:   HPI: Patient is a 29 y.o. female here for evaluation of bilateral knee pain.  Patient notes pain started several months ago.  She denies any injury or trauma.  The pain is located deep within her knee and his anterior knee.  Patient denies any bruising or swelling.  She denies any numbness or tingling.  Occasionally she will get mechanical locking catching of both knees but this is worse in her left knee than her right knee.  She denies any instability of her knees.  Patient will get some pain with deep squats and lunges.  Patient denies any previous problems or injuries to her knee in the past.   Review of Systems: See HPI above.  Past Medical History:  Diagnosis Date  . Gestational hypertension   . Hypertension   . Medical history non-contributory     Current Outpatient Medications on File Prior to Visit  Medication Sig Dispense Refill  . acetaminophen (TYLENOL) 325 MG tablet Take 2 tablets (650 mg total) by mouth every 4 (four) hours as needed (for pain scale < 4). (Patient not taking: Reported on 02/07/2020)    . Blood Pressure KIT 1 kit by Does not apply route once a week. Check BP Weekly.  Large Cuff  DX: Z13.6        Z34.86 1 kit 0  . butalbital-acetaminophen-caffeine (FIORICET) 50-325-40 MG tablet Take 1-2 tablets by mouth every 6 (six) hours as needed for headache. 20 tablet 0  . ferrous sulfate 325 (65 FE) MG tablet Take 1 tablet (325 mg total) by mouth 2 (two) times daily with a meal. 60 tablet 3  . ibuprofen (ADVIL) 600 MG tablet Take 1 tablet (600 mg total) by mouth every 6 (six) hours as needed for mild pain or moderate pain. (Patient not taking: Reported on 02/07/2020) 30 tablet 0  . NIFEdipine (ADALAT CC) 30 MG 24 hr tablet Take 1 tablet (30 mg total) by mouth daily. (Patient not taking: Reported on 02/07/2020) 30 tablet 0  . Prenatal Vit-Fe Fumarate-FA (PREPLUS) 27-1 MG TABS Take 1 tablet by mouth daily. 30 tablet 13   No current  facility-administered medications on file prior to visit.    Past Surgical History:  Procedure Laterality Date  . DILATION AND CURETTAGE OF UTERUS      Allergies  Allergen Reactions  . Feraheme [Ferumoxytol] Anaphylaxis    Throat tightening, shortness of breath, chest pain, nausea, severe back pain    Social History   Socioeconomic History  . Marital status: Married    Spouse name: Not on file  . Number of children: Not on file  . Years of education: Not on file  . Highest education level: Not on file  Occupational History  . Not on file  Tobacco Use  . Smoking status: Never Smoker  . Smokeless tobacco: Never Used  Substance and Sexual Activity  . Alcohol use: Never  . Drug use: Never  . Sexual activity: Yes  Other Topics Concern  . Not on file  Social History Narrative  . Not on file   Social Determinants of Health   Financial Resource Strain:   . Difficulty of Paying Living Expenses:   Food Insecurity:   . Worried About Charity fundraiser in the Last Year:   . Arboriculturist in the Last Year:   Transportation Needs:   . Film/video editor (Medical):   Marland Kitchen Lack of Transportation (Non-Medical):   Physical  Activity:   . Days of Exercise per Week:   . Minutes of Exercise per Session:   Stress:   . Feeling of Stress :   Social Connections:   . Frequency of Communication with Friends and Family:   . Frequency of Social Gatherings with Friends and Family:   . Attends Religious Services:   . Active Member of Clubs or Organizations:   . Attends Archivist Meetings:   Marland Kitchen Marital Status:   Intimate Partner Violence:   . Fear of Current or Ex-Partner:   . Emotionally Abused:   Marland Kitchen Physically Abused:   . Sexually Abused:     Family History  Problem Relation Age of Onset  . Arthritis Mother   . Hypertension Father   . Stroke Father         Objective:  Physical Exam: BP 106/80   Ht 5' (1.524 m)   Wt 130 lb (59 kg)   BMI 25.39 kg/m  Gen:  NAD, comfortable in exam room Lungs: Breathing comfortably on room air Knee Exam Bilateral -Inspection: no deformity, no discoloration -Palpation: medial joint line: Non-tender; lateral joint line: non-tender; quad tendon: non-tender; patella: non-tender; patellar tendon: non-tendon -ROM: Extension: -10 degrees; Flexion: 150 degrees -Strength: Extension: 5/5; Flexion: 5/5 -Special Tests: Varus Stress: Negative; Valgus Stress: Negative; Lachman: Negative; Posterior drawer: Negative; McMurray: Negative; Thessaly: Negative; Patellar grind: Negative -Limb neurovascularly intact, no instability noted  Minimal pain with double leg squat  Limited diagnostic ultrasound of bilateral knees Findings: -Normal appearance of the quadriceps tendon bilaterally -No fluid noted within the suprapatellar pouch bilaterally -Normal appearance of the patellar tendon bilaterally -Normal appearance of the trochlear groove bilaterally -Normal appearance of the medial meniscus bilaterally -Fraying noted of the lateral meniscus of the left knee.  There is a small amount of fluid surrounding the meniscus in this area. -Normal appearance of the lateral meniscus of the right knee Impression: -Possible injury to the lateral meniscus of the left knee.  The remainder of the ultrasound examination is normal Assessment & Plan:  Patient is a 29 y.o. female here for bilateral knee pain  1.  Bilateral patellofemoral syndrome -Ultrasound findings not showing any significant structural abnormality -Patient was given a home exercise program for hip abductor and VMO strengthening -Patient may take over-the-counter anti-inflammatories as needed  Patient will follow up in 4 to 6 weeks as needed  Addendum:  I was the preceptor for this visit and available for immediate consultation.  Karlton Lemon MD [facility]

## 2020-04-08 NOTE — Patient Instructions (Signed)
Your knee pain is caused by patellofemoral syndrome.  This is where you get irritation to the cartilage behind your kneecap due to muscle imbalance and biomechanical abnormalities within the knee.  This causes the kneecap to not track properly within the groove. -Work on the exercises shown to you at today's visit to help with the tracking of your kneecap within its groove. -You may take over-the-counter anti-inflammatories as needed for pain -The ultrasound we did today did not show any major structural abnormalities within the knee  I will see back in 4 to 6 weeks if your pain is not improved

## 2021-02-11 IMAGING — DX DG KNEE COMPLETE 4+V*L*
4 series · 4 of 4 positions shown · non-contrast
Comparison: None.

CLINICAL DATA: Knee pain and laxity

EXAM:
LEFT KNEE - COMPLETE 4+ VIEW

[knee ap]
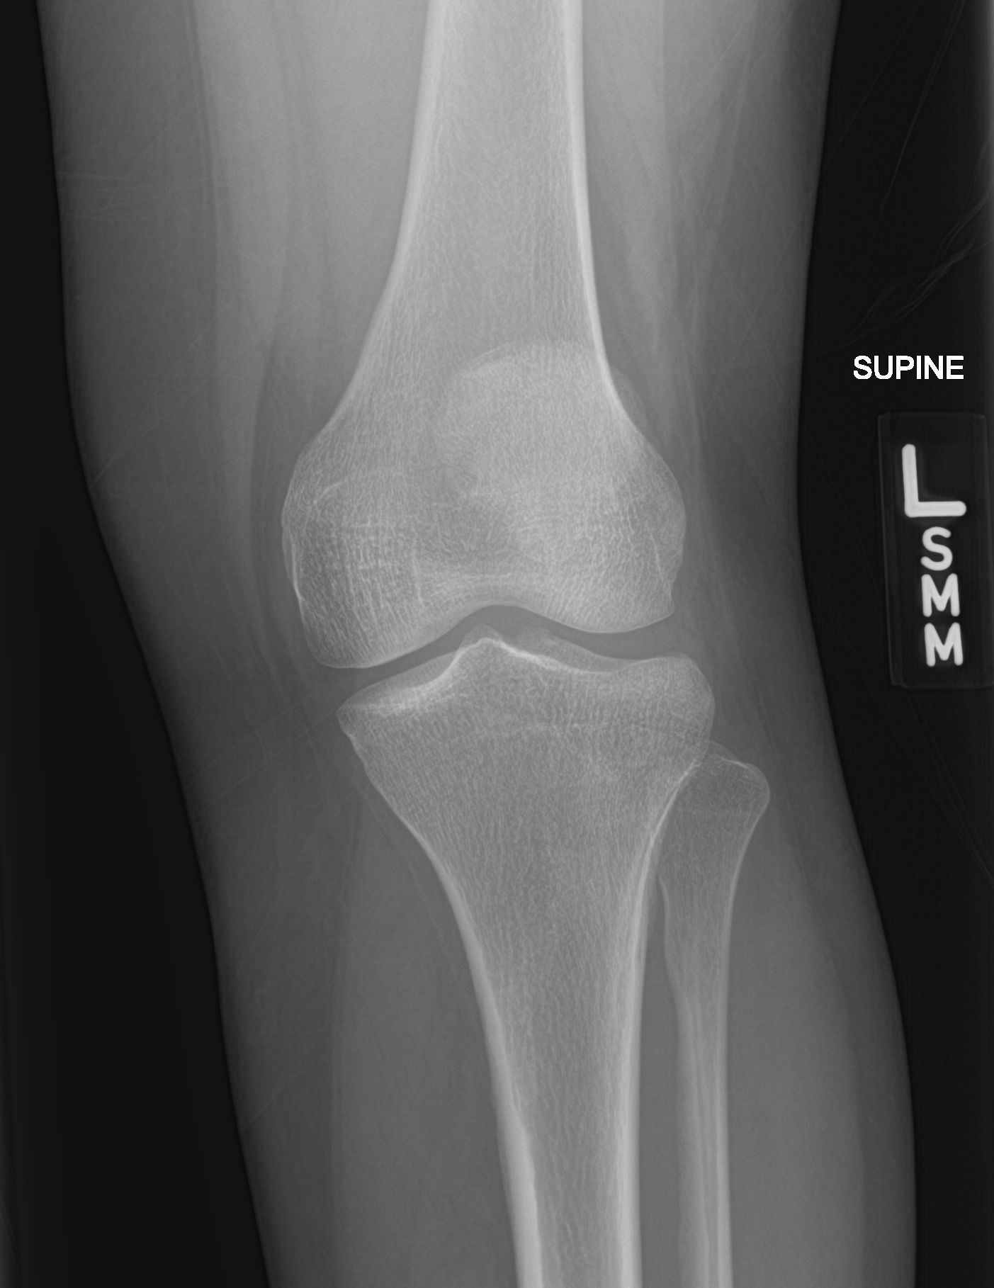

[knee obl (1 of 2)]
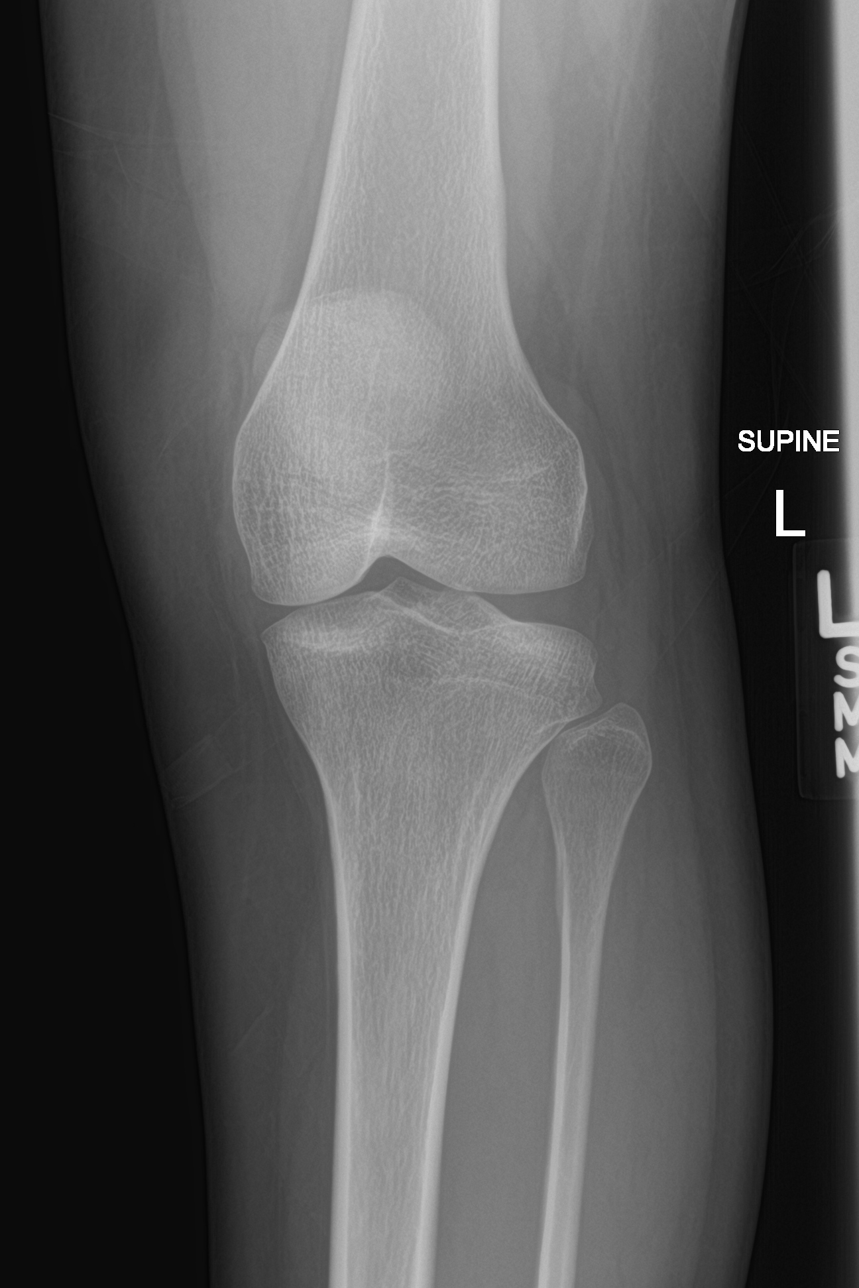

[knee obl (2 of 2)]
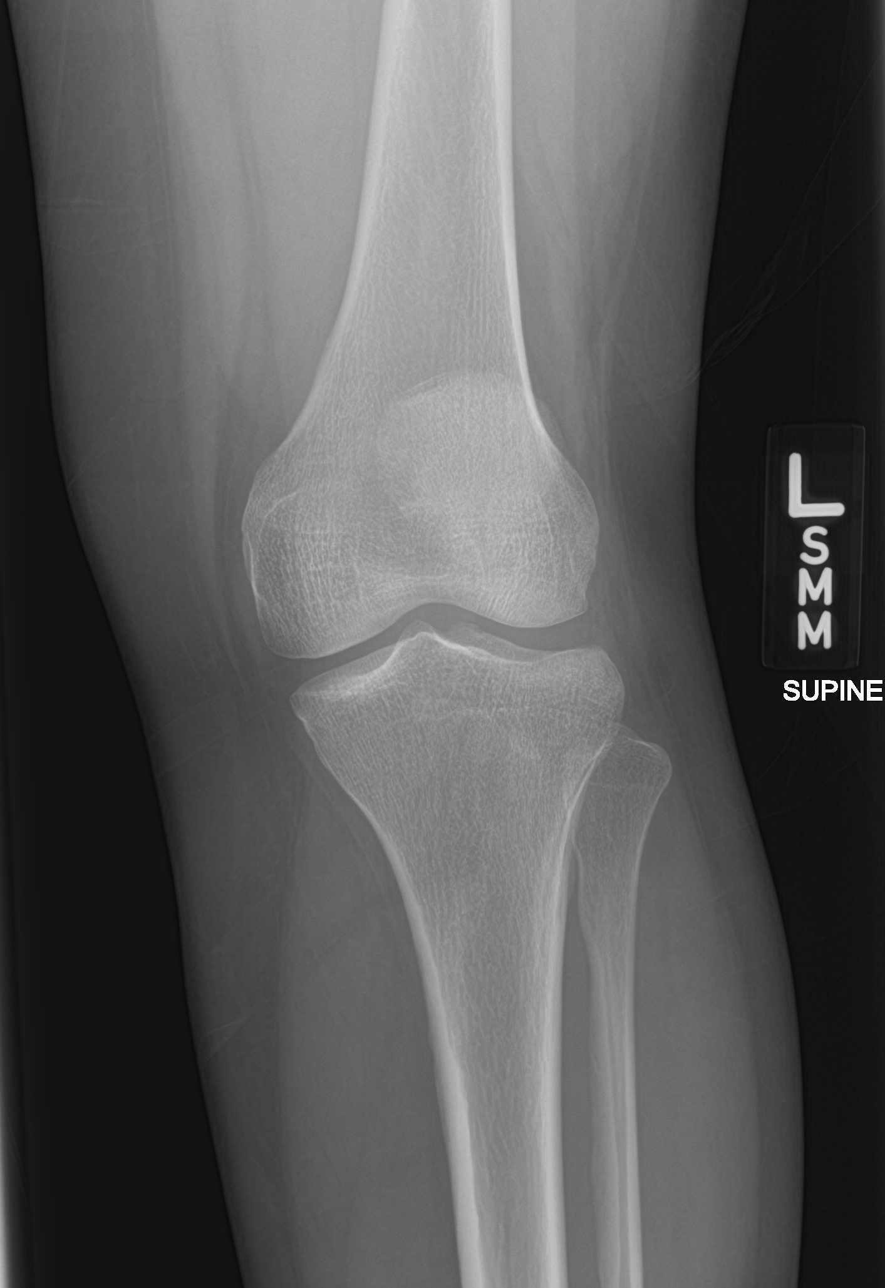

[knee lat]
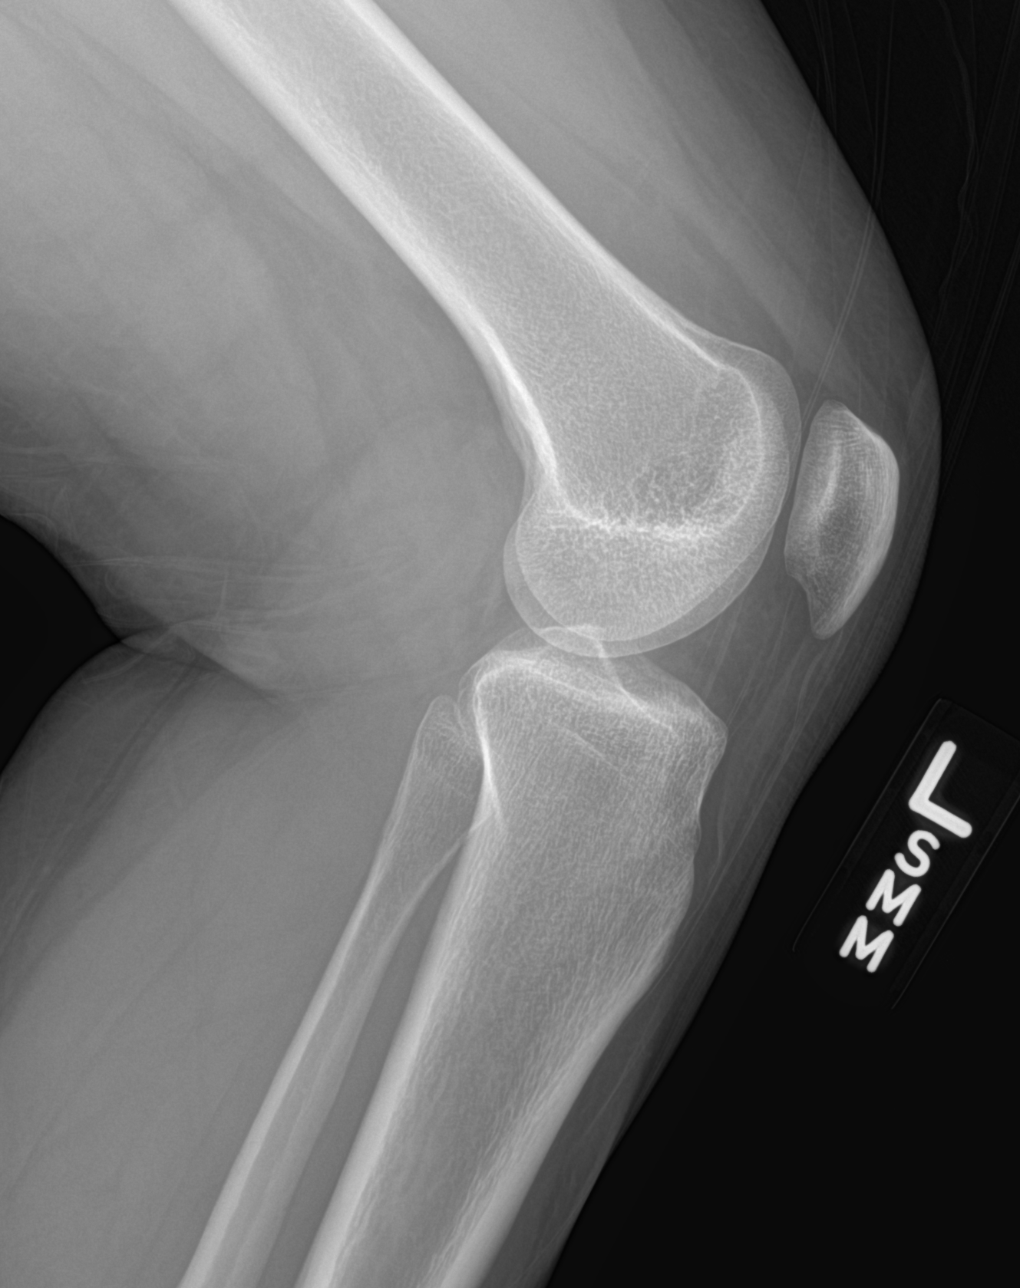

[4 of 4 positions shown; findings below may reference images not displayed]

FINDINGS: No evidence of fracture, dislocation, or joint effusion. No evidence
of arthropathy or other focal bone abnormality. Soft tissues are
unremarkable.
IMPRESSION: Negative.
